# Patient Record
Sex: Female | Born: 1970 | Race: White | Hispanic: No | Marital: Married | Smoking: Never smoker
Health system: Southern US, Community
[De-identification: ages and names within clinical notes are randomized; demographics above are authoritative.]

## PROBLEM LIST (undated history)

## (undated) DIAGNOSIS — M1990 Unspecified osteoarthritis, unspecified site: Secondary | ICD-10-CM

## (undated) DIAGNOSIS — F32A Depression, unspecified: Secondary | ICD-10-CM

## (undated) DIAGNOSIS — K219 Gastro-esophageal reflux disease without esophagitis: Secondary | ICD-10-CM

## (undated) DIAGNOSIS — K5900 Constipation, unspecified: Secondary | ICD-10-CM

## (undated) DIAGNOSIS — F329 Major depressive disorder, single episode, unspecified: Secondary | ICD-10-CM

## (undated) DIAGNOSIS — F419 Anxiety disorder, unspecified: Secondary | ICD-10-CM

## (undated) HISTORY — DX: Unspecified osteoarthritis, unspecified site: M19.90

## (undated) HISTORY — PX: IUD REMOVAL: SHX5392

## (undated) HISTORY — DX: Major depressive disorder, single episode, unspecified: F32.9

## (undated) HISTORY — DX: Anxiety disorder, unspecified: F41.9

## (undated) HISTORY — PX: TOOTH EXTRACTION: SUR596

## (undated) HISTORY — DX: Gastro-esophageal reflux disease without esophagitis: K21.9

## (undated) HISTORY — DX: Depression, unspecified: F32.A

## (undated) HISTORY — DX: Constipation, unspecified: K59.00

---

## 1998-03-29 ENCOUNTER — Other Ambulatory Visit: Admission: RE | Admit: 1998-03-29 | Discharge: 1998-03-29 | Payer: Self-pay | Admitting: Obstetrics & Gynecology

## 1999-07-20 ENCOUNTER — Other Ambulatory Visit: Admission: RE | Admit: 1999-07-20 | Discharge: 1999-07-20 | Payer: Self-pay | Admitting: Obstetrics and Gynecology

## 1999-08-23 ENCOUNTER — Ambulatory Visit (HOSPITAL_COMMUNITY): Admission: RE | Admit: 1999-08-23 | Discharge: 1999-08-23 | Payer: Self-pay | Admitting: Internal Medicine

## 2002-11-22 ENCOUNTER — Other Ambulatory Visit: Admission: RE | Admit: 2002-11-22 | Discharge: 2002-11-22 | Payer: Self-pay | Admitting: *Deleted

## 2003-02-20 ENCOUNTER — Inpatient Hospital Stay (HOSPITAL_COMMUNITY): Admission: AD | Admit: 2003-02-20 | Discharge: 2003-02-20 | Payer: Self-pay | Admitting: Obstetrics and Gynecology

## 2003-05-12 ENCOUNTER — Inpatient Hospital Stay (HOSPITAL_COMMUNITY): Admission: AD | Admit: 2003-05-12 | Discharge: 2003-05-12 | Payer: Self-pay | Admitting: Obstetrics and Gynecology

## 2003-06-20 ENCOUNTER — Inpatient Hospital Stay (HOSPITAL_COMMUNITY): Admission: AD | Admit: 2003-06-20 | Discharge: 2003-06-25 | Payer: Self-pay | Admitting: Obstetrics and Gynecology

## 2003-06-26 ENCOUNTER — Encounter: Admission: RE | Admit: 2003-06-26 | Discharge: 2003-07-26 | Payer: Self-pay | Admitting: Obstetrics and Gynecology

## 2003-07-27 ENCOUNTER — Encounter: Admission: RE | Admit: 2003-07-27 | Discharge: 2003-08-26 | Payer: Self-pay | Admitting: Obstetrics and Gynecology

## 2003-08-05 ENCOUNTER — Other Ambulatory Visit: Admission: RE | Admit: 2003-08-05 | Discharge: 2003-08-05 | Payer: Self-pay | Admitting: Obstetrics and Gynecology

## 2003-08-11 ENCOUNTER — Encounter: Admission: RE | Admit: 2003-08-11 | Discharge: 2003-08-11 | Payer: Self-pay | Admitting: Obstetrics and Gynecology

## 2003-08-27 ENCOUNTER — Encounter: Admission: RE | Admit: 2003-08-27 | Discharge: 2003-09-26 | Payer: Self-pay | Admitting: Obstetrics and Gynecology

## 2004-05-21 ENCOUNTER — Encounter: Admission: RE | Admit: 2004-05-21 | Discharge: 2004-05-21 | Payer: Self-pay | Admitting: *Deleted

## 2004-08-15 ENCOUNTER — Other Ambulatory Visit: Admission: RE | Admit: 2004-08-15 | Discharge: 2004-08-15 | Payer: Self-pay | Admitting: Obstetrics and Gynecology

## 2005-06-11 ENCOUNTER — Encounter: Admission: RE | Admit: 2005-06-11 | Discharge: 2005-06-11 | Payer: Self-pay | Admitting: Family Medicine

## 2005-09-11 ENCOUNTER — Other Ambulatory Visit: Admission: RE | Admit: 2005-09-11 | Discharge: 2005-09-11 | Payer: Self-pay | Admitting: Obstetrics and Gynecology

## 2006-03-20 ENCOUNTER — Inpatient Hospital Stay (HOSPITAL_COMMUNITY): Admission: RE | Admit: 2006-03-20 | Discharge: 2006-03-23 | Payer: Self-pay | Admitting: Obstetrics and Gynecology

## 2006-03-24 ENCOUNTER — Encounter: Admission: RE | Admit: 2006-03-24 | Discharge: 2006-04-22 | Payer: Self-pay | Admitting: Obstetrics and Gynecology

## 2006-04-23 ENCOUNTER — Encounter: Admission: RE | Admit: 2006-04-23 | Discharge: 2006-05-23 | Payer: Self-pay | Admitting: Obstetrics and Gynecology

## 2006-05-24 ENCOUNTER — Encounter: Admission: RE | Admit: 2006-05-24 | Discharge: 2006-06-22 | Payer: Self-pay | Admitting: Obstetrics and Gynecology

## 2006-06-23 ENCOUNTER — Encounter: Admission: RE | Admit: 2006-06-23 | Discharge: 2006-07-23 | Payer: Self-pay | Admitting: Obstetrics and Gynecology

## 2006-07-24 ENCOUNTER — Encounter: Admission: RE | Admit: 2006-07-24 | Discharge: 2006-08-23 | Payer: Self-pay | Admitting: Obstetrics and Gynecology

## 2006-08-24 ENCOUNTER — Encounter: Admission: RE | Admit: 2006-08-24 | Discharge: 2006-09-21 | Payer: Self-pay | Admitting: Obstetrics and Gynecology

## 2006-09-22 ENCOUNTER — Encounter: Admission: RE | Admit: 2006-09-22 | Discharge: 2006-10-22 | Payer: Self-pay | Admitting: Obstetrics and Gynecology

## 2006-10-23 ENCOUNTER — Encounter: Admission: RE | Admit: 2006-10-23 | Discharge: 2006-10-28 | Payer: Self-pay | Admitting: Obstetrics and Gynecology

## 2007-10-09 ENCOUNTER — Ambulatory Visit (HOSPITAL_COMMUNITY): Admission: RE | Admit: 2007-10-09 | Discharge: 2007-10-09 | Payer: Self-pay | Admitting: Obstetrics and Gynecology

## 2007-10-09 ENCOUNTER — Encounter (INDEPENDENT_AMBULATORY_CARE_PROVIDER_SITE_OTHER): Payer: Self-pay | Admitting: Obstetrics and Gynecology

## 2010-03-23 ENCOUNTER — Encounter: Payer: Self-pay | Admitting: Cardiology

## 2010-05-16 DIAGNOSIS — R42 Dizziness and giddiness: Secondary | ICD-10-CM

## 2010-05-16 DIAGNOSIS — R0602 Shortness of breath: Secondary | ICD-10-CM

## 2010-05-16 DIAGNOSIS — F411 Generalized anxiety disorder: Secondary | ICD-10-CM | POA: Insufficient documentation

## 2010-05-16 DIAGNOSIS — K219 Gastro-esophageal reflux disease without esophagitis: Secondary | ICD-10-CM | POA: Insufficient documentation

## 2010-05-16 DIAGNOSIS — E559 Vitamin D deficiency, unspecified: Secondary | ICD-10-CM | POA: Insufficient documentation

## 2010-05-17 ENCOUNTER — Encounter: Payer: Self-pay | Admitting: Cardiology

## 2010-05-17 ENCOUNTER — Ambulatory Visit: Payer: Self-pay | Admitting: Cardiology

## 2010-05-17 DIAGNOSIS — I059 Rheumatic mitral valve disease, unspecified: Secondary | ICD-10-CM | POA: Insufficient documentation

## 2010-05-17 DIAGNOSIS — I959 Hypotension, unspecified: Secondary | ICD-10-CM

## 2010-05-29 ENCOUNTER — Ambulatory Visit: Payer: Self-pay

## 2010-05-29 ENCOUNTER — Ambulatory Visit: Payer: Self-pay | Admitting: Cardiovascular Disease

## 2010-05-29 ENCOUNTER — Encounter: Payer: Self-pay | Admitting: Cardiology

## 2010-05-29 ENCOUNTER — Telehealth: Payer: Self-pay | Admitting: Cardiology

## 2010-05-29 ENCOUNTER — Ambulatory Visit (HOSPITAL_COMMUNITY): Admission: RE | Admit: 2010-05-29 | Discharge: 2010-05-29 | Payer: Self-pay | Admitting: Cardiology

## 2010-08-07 ENCOUNTER — Encounter
Admission: RE | Admit: 2010-08-07 | Discharge: 2010-08-07 | Payer: Self-pay | Source: Home / Self Care | Attending: Obstetrics and Gynecology | Admitting: Obstetrics and Gynecology

## 2010-08-14 NOTE — Progress Notes (Signed)
Summary: [facility] Annual Physical Exam Note   [facility] Physical Exam Note   Imported By: Roderic Ovens 05/29/2010 13:14:59  _____________________________________________________________________  External Attachment:    Type:   Image     Comment:   External Document

## 2010-08-14 NOTE — Assessment & Plan Note (Signed)
Summary: NP6/ H/O DYSFEN , HEART MUMUR , PT HAS BCBS./ GD   CC:  pt states she has mitral valve prolapse....pt states she been feeling tired and states she feels like her BP is low...she states she has seen Dr. Tenny Craw in the past.  History of Present Illness: 40 year old female for evaluation of mitral valve prolapse. Patient seen in the past by Dr. Tenny Craw for syncope. She had a positive tilt table. Apparently a previous echo showed mitral valve prolapse but I do not have those records available. There was also a question of autonomic dysfunction. She has not been seen for approximately 10 years. She denies dyspnea on exertion, orthopnea, PND, pedal edema, chest pain. She does describe low blood pressure chronically. She has some lightheadedness with standing but has had no further syncopal episodes since she was seen previously. She occasionally feels a brief skipped. Because of her history of mitral valve prolapse she requested an evaluation.  Current Medications (verified): 1)  Zoloft 50 Mg Tabs (Sertraline Hcl) .Marland Kitchen.. 1 Tab By Mouth Once Daily 2)  Nexium 20 Mg Cpdr (Esomeprazole Magnesium) .Marland Kitchen.. 1  Tab By Mouth Two Times A Day 3)  Birth Control .... As Directed 4)  Multivitamins   Tabs (Multiple Vitamin) .Marland Kitchen.. 1  Tab By Mouth Once Daily 5)  Latisse 0.03 % Soln (Bimatoprost) .... As Directed 6)  Vit D .... 1 Tab By Mouth Once Daily  Allergies: 1)  ! 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  Repeat low-transverse cesarean section.  Family History: Reviewed history from 05/16/2010 and no changes required. Pancreatitis DM HTN Father with CAD at unknown age  Social History: Reviewed history from 05/16/2010 and no changes  required. Married  Tobacco Use - No.  Alcohol Use - yes Regular Exercise - yes 2 children  Review of Systems       no fevers or chills, productive cough, hemoptysis, dysphasia, odynophagia, melena, hematochezia, dysuria, hematuria, rash, seizure activity, orthopnea, PND, pedal edema, claudication. Remaining systems are negative.   Vital Signs:  Patient profile:   40 year old female Height:      65 inches Weight:      136 pounds BMI:     22.71 Pulse rate:   79 / minute Resp:     14 per minute BP sitting:   90 / 70  (left arm)  Vitals Entered By: Kem Parkinson (May 17, 2010 2:54 PM)  Physical Exam  General:  Well developed/well nourished in NAD Skin warm/dry Patient not depressed No peripheral clubbing Back-normal HEENT-normal/normal eyelids Neck supple/normal carotid upstroke bilaterally; no bruits; no JVD; no thyromegaly chest - CTA/ normal expansion CV - RRR/normal S1 and S2; no murmurs, rubs or gallops;  PMI nondisplaced Abdomen -NT/ND, no HSM, no mass, + bowel sounds, no bruit 2+ femoral pulses, no bruits Ext-no edema, chords, 2+ DP Neuro-grossly nonfocal     EKG  Procedure date:  05/17/2010  Findings:      Sinus rhythm with no ST changes.  Impression & Recommendations:  Problem # 1:  MITRAL VALVE PROLAPSE (ICD-424.0) Not evident on examination. Repeat echocardiogram. Orders: Echocardiogram (Echo)  Problem # 2:  HYPOTENSION (ICD-458.9) Patient has an some orthostatic symptoms and has chronically low blood pressure. It does not appear he be significantly limiting.  I've asked her to liberalize her fluid and sodium intake. Medications can be considered in the future if it becomes more problematic.  Problem # 3:  GERD (ICD-530.81)  Her updated medication list for this problem includes:    Nexium 20 Mg Cpdr (Esomeprazole magnesium) .Marland Kitchen... 1  tab by mouth two times a day  Other Orders: EKG w/ Interpretation (93000)  Patient Instructions: 1)   Your physician recommends that you schedule a follow-up appointment in: as needed 2)  Your physician recommends that you continue on your current medications as directed. Please refer to the Current Medication list given to you today. 3)  Your physician has requested that you have an echocardiogram.  Echocardiography is a painless test that uses sound waves to create images of your heart. It provides your doctor with information about the size and shape of your heart and how well your heart's chambers and valves are working.  This procedure takes approximately one hour. There are no restrictions for this procedure.

## 2010-08-14 NOTE — Progress Notes (Signed)
Summary: pt returning call  Phone Note Call from Patient   Caller: Patient [phone] Reason for Call: Talk to Nurse Summary of Call: pt returning call to christine Initial call taken by: Glynda Jaeger,  May 29, 2010 3:56 PM  Follow-up for Phone Call        Phone Call Completed PT AWARE OF ECHO RESULTS. Follow-up by: Scherrie Bateman, LPN,  May 29, 2010 4:31 PM

## 2010-11-27 NOTE — Op Note (Signed)
[name], [name]              ACCOUNT NO.:  [id number]   MEDICAL RECORD NO.:  [id number]          PATIENT TYPE:  AMB   LOCATION:  SDC                           FACILITY:  [facility]   PHYSICIAN:  Michelle L. Grewal, M.D.DATE OF BIRTH:  06-02-1971   DATE OF PROCEDURE:  10/09/2007  DATE OF DISCHARGE:                               OPERATIVE REPORT   PREOPERATIVE DIAGNOSIS:  Retained intrauterine device.   POSTOPERATIVE DIAGNOSIS:  Retained intrauterine device.   OPERATION/PROCEDURE:  Removal of intrauterine device.   SURGEON:  Michelle L. Vincente Poli, M.D.   ANESTHESIA:  MAC with local.   FINDINGS:  The IUD is in normal location.   SPECIMENS:  IUD.   PATHOLOGY:  None.   ESTIMATED BLOOD LOSS:  Minimal.   COMPLICATIONS:  None.   PROCEDURE:  The patient was taken to the operating room.  She was given  sedation, placed in the lithotomy position.  She is prepped and draped  in the usual sterile fashion.  In and out catheter was used to empty the  bladder.  The speculum was inserted.  The cervix was grasped with a  tenaculum and a paracervical block was performed at 5 and 7 o'clock in  standard fashion.  Of note, IUD strings were not visualized.  I then  inserted the IUD hook, was able to locate the IUD in the cavity, then  removed the hook and then inserted a small polyp forceps and removed the  IUD easily with one attempt.  The string was attached but was wrapped  around the IUD apparatus. There was minimal bleeding noted.  All  instruments were removed from the vagina.  All sponge, lap and  instrument counts were correct x2.      Michelle L. Vincente Poli, M.D.  Electronically Signed     MLG/MEDQ  D:  10/09/2007  T:  10/10/2007  Job:  [id number]

## 2010-11-30 NOTE — Discharge Summary (Signed)
[name], [name]              ACCOUNT NO.:  [id number]   MEDICAL RECORD NO.:  [id number]          PATIENT TYPE:  INP   LOCATION:  9145                          FACILITY:  [facility]   PHYSICIAN:  Juluis Mire, M.D.   DATE OF BIRTH:  [birth year]   DATE OF ADMISSION:  03/20/2006  DATE OF DISCHARGE:  03/23/2006                                 DISCHARGE SUMMARY   ADMISSION DIAGNOSES:  1. Intrauterine pregnancy at term.  2. Previous cesarean section, desires repeat.   DISCHARGE DIAGNOSES:  1. Status post low transverse cesarean section.  2. A viable female infant.   PROCEDURE:  Repeat low transverse cesarean section.   REASON FOR ADMISSION:  Please see written H&P.   HOSPITAL COURSE:  The patient is 40 year old gravida 3, para 1, that  presented to [facility] for a scheduled cesarean section.  On the morning of admission the patient was taken to the operating room,  where spinal anesthesia was administered without difficulty.  A low  transverse incision was made with delivery of a viable female infant weighing  7 pounds 15 ounces, Apgars of 8 at one and 9 at five minutes.  The patient  tolerated the procedure well and was taken to the recovery room in stable  condition.  On postoperative day #1 the patient was without complaint with  exception of some erythema that she had noticed on her abdomen secondary to  adhesed of her surgical drape.  Vital signs were stable.  She was afebrile.  Abdomen soft.  Fundus firm and nontender.  Abdominal dressing was noted to  have a small amount of drainage noted on the bandage.  Laboratory findings  revealed hemoglobin of 10.9, platelet count of 250,000, WBC count of 10.0.  The patient was ordered to have some hydrocortisone to apply to the abdomen.  On postoperative day #2 the patient was without complaint.  Vital signs were  stable.  Fundus firm and nontender.  Abdominal dressing had been removed  revealing an incision that was clean,  dry and intact.  Erythema from  surgical drape was noted to be significantly improved.  She is ambulating  well.  On postoperative day #3 the patient was without complaint.  Vital  signs were stable.  Fundus firm and nontender.  Incision was clean, dry and  intact.  Staples were removed and the patient was later discharged home.   CONDITION ON DISCHARGE:  Good.   DIET:  Regular as tolerated.   ACTIVITY:  No heavy lifting, no driving x2 weeks, no vaginal entry.   FOLLOW-UP:  Patient to follow up in the office in 1 week for an incision  check.  She is to call for temperature greater than 100 degrees, persistent  nausea or vomiting, heavy vaginal bleeding and/or redness or drainage from  the incisional site.   DISCHARGE MEDICATIONS:  1. Tylox, number 30, one p.o. every 4-6 hour p.r.n.  2. Motrin 600 mg every 6 hours.  3. Prenatal vitamins one p.o. daily.  4. Colace one p.o. daily p.r.n.      [name], N.P.  Juluis Mire, M.D.  Electronically Signed    CC/MEDQ  D:  04/22/2006  T:  04/23/2006  Job:  [id number]

## 2010-11-30 NOTE — Op Note (Signed)
[name], EBER                        ACCOUNT NO.:  [id number]   MEDICAL RECORD NO.:  [id number]                   PATIENT TYPE:  INP   LOCATION:  9124                                 [facility]ACILITY:  [facility]   PHYSICIAN:  Michelle L. Vincente Poli, M.D.            DATE OF BIRTH:  [birth year]   DATE OF PROCEDURE:  06/21/2003  DATE OF DISCHARGE:                                 OPERATIVE REPORT   PREOPERATIVE DIAGNOSES:  1. Intrauterine pregnancy at term.  2. Failure to progress.   POSTOPERATIVE DIAGNOSES:  1. Intrauterine pregnancy at term.  2. Failure to progress.   PROCEDURE:  Primary low transverse cesarean section.   SURGEON:  Michelle L. Vincente Poli, M.D.   ANESTHESIA:  Epidural.   ESTIMATED BLOOD LOSS:  500 mL.   FINDINGS:  A female infant in cephalic presentation, Apgars 9 at one minute  and 9 at five minutes, weighing 8 pounds 2 ounces.   DESCRIPTION OF PROCEDURE:  The patient was taken to the operating room.  Epidural was dosed and found to be adequate.  She was prepped and draped in  the usual sterile fashion.  A Foley catheter was already in the bladder.  A  low transverse incision was made and carried down to the fascia.  The fascia  was scored in the midline and then entered.  The Pfannenstiel incision was  developed.  The rectus muscles were separated in the midline and __________  peritoneum __________ Bladder blade was then inserted into the peritoneal  cavity and the bladder blade was created sharply and digitally.  The bladder  blade was readjusted.  A low transverse incision was made in the uterus and  the uterus was entered using a hemostat.  The amniotic fluid was noted to be  clear.  The head was noted to be hyperextended.  It delivered easily with  the assistance of a vacuum.  The baby was a female infant with Apgars of 9 at  one minute and 9 at five minutes, and he weighed 8 pounds 2 ounces.  The  baby was handed to the waiting pediatrician and subsequently taken to  the  newborn nursery.  The placenta was manually removed, this appeared to be  normal in appearance and was removed.  The uterus was exteriorized and  cleared of all clots and debris.  The uterus was normal and adnexa were  normal.  The uterine incision was closed using 0 chromic in continuous  running locked stitch.  It was hemostatic.  The uterus was returned to the  abdomen.  The peritoneum was closed using 0 Vicryl continuous running  stitch,and the rectus muscles were reapproximated using the same stitch.  The fascia was closed using 0 Vicryl in a continuous running stitch starting  at each corner and meeting in the  midline.  After irrigation of the subcutaneous layer, the skin was closed  with staples.  All sponge, lap, and instrument  counts were correct x2.  The  patient tolerated the procedure well and went to the recovery room in stable  condition.                                               Michelle L. Vincente Poli, M.D.    Florestine Avers  D:  06/21/2003  T:  06/22/2003  Job:  [id number]

## 2010-11-30 NOTE — Procedures (Signed)
[place] [address]  Patient:    [name], [name]                        MRN: [id number] Proc. Date: 08/23/99 Adm. Date:  [id number] Disc. Date: [id number] Attending:  Lewayne Bunting CC:         Dietrich Pates, M.D., [facility]             Kathrine Cords             Marcelle Overlie, M.D.                           Procedure Report  PROCEDURE:  Head-up tilt table testing.  INDICATION:  The patient is a very pleasant 40 year old young lady with a several month history of syncope.  She is subsequently referred for head-up tilt table testing.  DESCRIPTION OF PROCEDURE:  After informed consent was obtained, the patient was  taken to the diagnostic EP lab in the fasted state.  After the usual preparation, the patient was placed in the supine position and her blood pressure was 125/67 and a pulse of 79.  She was allowed to maintain this position for five minutes and hen tilted to the 70 degree head-up position.  Her heart rate initially went down to 115/71 with a pulse of 82.  At five minutes in the tilting, she became diaphoretic, her pupils became dilated, and she was essentially unresponsive.  She had a heart rate that dropped down to 45 beats per minute and her blood pressure was 60 over palpable.  She became somewhat unresponsive and then she was placed back immediately in the supine position where she gradually had improvement in her symptoms and returned to stable blood pressure and heart rate.  She was returned to her room in good condition.  COMPLICATIONS:  None.  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  RECOMMENDATIONS:  The patient will follow up with Dr. Dietrich Pates.  I initially recommended that she be maintained on a high salt, high fluid diet and given her instructions on recognizing the warning signs of recurrent syncopal episodes. he may require other medical  therapy, although at this time she prefers not to. DD:  08/23/99 TD:  08/25/99 Job: [id number]

## 2010-11-30 NOTE — Op Note (Signed)
[name], [name]              ACCOUNT NO.:  [id number]   MEDICAL RECORD NO.:  [id number]          PATIENT TYPE:  INP   LOCATION:  9145                          [facility]ILITY[facility]   PHYSICIAN:  Michelle L. Grewal, M.D.DATE OF BIRTH:  Aug 09, 1970   DATE OF PROCEDURE:  03/20/2006  DATE OF DISCHARGE:                                 OPERATIVE REPORT   PREOPERATIVE DIAGNOSIS:  Intrauterine pregnancy at term, previous cesarean  section.   POSTOPERATIVE DIAGNOSIS:  Intrauterine pregnancy at term, previous cesarean  section.   PROCEDURE:  Repeat low-transverse cesarean section.   SURGEON:  Dr. Vincente Poli.   ANESTHESIA:  Spinal by Dr. Harvest Forest.   SPECIMENS:  Female infant, Apgars 9 at one minute, 9 at five minutes.   ESTIMATED BLOOD LOSS:  500 mL.   COMPLICATIONS:  None.   PROCEDURE:  Patient taken to the operating room.  Spinal was placed without  incident.  She is prepped and draped in the usual sterile fashion.  A Foley  catheter is inserted.  A low transverse incision was made and carried down  to the fascia.  Fascia scored in the midline and extended laterally.  The  rectus muscles were separated in the midline.  The peritoneum was entered  bluntly.  The peritoneal incision was then stretched.  The lower uterine  segment was identified.  The bladder flap was created sharply and then  digitally.  The bladder blade was readjusted.  A low transverse incision was  made in the uterus.  Uterus was entered using hemostat.  The baby was in  cephalic presentation and was delivered easily with one gentle pull of the  vacuum.  The baby was a female infant with Apgar 9 at one minute and 9 at five  minutes.  The cord was clamped and cut.  The baby was handed to the waiting  pediatrician, and the placenta was manually removed.  The uterus was  exteriorized and cleared of all clots and debris.  The uterus was closed in  1 layer using 0 chromic continuous running locked stitch.  The uterus was  returned  to the abdomen.  Irrigation was performed.  The peritoneum was  closed using 0 Vicryl continuous running stitch.  The rectus muscles were  reapproximated as well.  The fascia was closed using 0 Vicryl continuous  running stitch starting at each corner and meeting at the midline.  After  irrigation of subcutaneous layer, the skin was closed with staples.  All  sponge, lap and instrument counts were correct x2.  The patient went to  recovery room in stable condition.      Michelle L. Vincente Poli, M.D.  Electronically Signed    MLG/MEDQ  D:  03/20/2006  T:  03/20/2006  Job:  [id number]

## 2010-11-30 NOTE — Discharge Summary (Signed)
[name], [name]                        ACCOUNT NO.:  [id number]   MEDICAL RECORD NO.:  [id number]                   PATIENT TYPE:  INP   LOCATION:  9124                                 [facility]   PHYSICIAN:  Duke Salvia. Marcelle Overlie, M.D.            DATE OF BIRTH:  [birth year]   DATE OF ADMISSION:  06/20/2003  DATE OF DISCHARGE:  06/25/2003                                 DISCHARGE SUMMARY   ADMITTING DIAGNOSES:  1. Intrauterine pregnancy at 39-5/7 weeks' estimated gestational age.  2. Induction of labor.   DISCHARGE DIAGNOSIS:  Status post low-transverse cesarean section secondary  to failure to progress to a viable female infant.   PROCEDURE:  Primary low-transverse cesarean section.   REASON FOR ADMISSION:  Please see written H&P.   HOSPITAL COURSE:  The patient was a 40 year old white, married female,  gravida 2 para 0 that was admitted to [facility] for a two-  stage induction due to prolonged prodromal labor.  Fetal heart tones were  reactive.  The cervix was closed at the internal os, 30% effaced with a  vertex presentation.  Cytotec was placed into the vaginal fornix.  On the  following morning, fetal heart tones were reactive.  The patient was feeling  more pressure with uterine contractions.  The cervix was now dilated to 1.5-  cm dilated, 50% effaced.  Artifical rupture of membranes was performed  revealing clear fluid.  The patient was known to be positive for group B  beta strep and IV antibiotics were administered.  Pitocin was started per  protocol and an epidural was given for the patient's comfort.  Despite  adequate labor, as determined by intrauterine pressure catheter, there was  no further change in cervical dilatation.  The decision was made then to  proceed with a low-transverse cesarean section.  The patient was then  transferred to the operating room where an epidural was dosed adequately to  a surgical level.  A low transverse  incision was made with the delivery of a  viable female infant weighing 8 pounds 2 ounces with Apgar's of 9 at one  minute and 9 at five minutes.  The patient tolerated the procedure well and  was taken to a recovery room in stable condition.   On postoperative day one:  Vital signs are stable.  She was afebrile.  Abdomen was soft with good return of bowel function.  Fundus was firm and  nontender.  Abdominal dressing was noted to be clean, dry and intact.  Labs  revealed a hemoglobin of 10.1, platelet count of 199,000, WBC count of 13.6.  On postoperative day two: The patient was without complaint.  Abdomen was  soft.  Some slight ecchymosis was noted above the incisional site.  Fundus  was firm and nontender.  Incision was clean, dry and intact.  The patient  was ambulating well, tolerating a regular diet without complaints of nausea  or vomiting.  On postoperative day three:  Vital signs are stable.  Abdomen  was soft.  Ecchymosis continued.  Fundus was firm and nontender.  Incision  was clean, dry and intact.  On postoperative day four:  The patient was  doing well.  She was ambulating without assistance.  The incision was clean,  dry and intact.  Staples removed.  The patient was discharged home.   CONDITION ON DISCHARGE:  Good.   DIAGNOSIS:  Regular as tolerated.   DISCHARGE INSTRUCTIONS:  No heavy lifting.  No driving x 2 weeks.  No  vaginal entry.   FOLLOWUP:  The patient is to followup in the office in one week for an  incision check.  She is to call for temperature greater than 100 degrees,  persistent nausea, vomiting, heavy vaginal bleeding, any redness or drainage  from the incisional site.   DISCHARGE MEDICATIONS:  1. Tylox, #30, one p.o. q.4-6h. p.r.n. pain.  2. Motrin 600 mg every six hours p.r.n.  3. Prenatal vitamins one p.o. daily.  4. Colace one p.o. daily p.r.n.     [name], N.P.                        [name], M.D.    CC/MEDQ  D:   08/02/2003  T:  08/03/2003  Job:  [id number]

## 2011-02-18 ENCOUNTER — Other Ambulatory Visit: Payer: Self-pay | Admitting: Sports Medicine

## 2011-02-18 DIAGNOSIS — M25551 Pain in right hip: Secondary | ICD-10-CM

## 2011-02-19 ENCOUNTER — Ambulatory Visit
Admission: RE | Admit: 2011-02-19 | Discharge: 2011-02-19 | Disposition: A | Discharge status: Home / Self Care | Payer: BC Managed Care – PPO | Source: Ambulatory Visit | Attending: Sports Medicine | Admitting: Sports Medicine

## 2011-02-19 DIAGNOSIS — M25551 Pain in right hip: Secondary | ICD-10-CM

## 2011-04-08 LAB — CBC
HCT: 39.4
Hemoglobin: 13.8
MCHC: 35
MCV: 90
Platelets: 292
RBC: 4.38
RDW: 12.3
WBC: 4.8

## 2011-04-08 LAB — PREGNANCY, URINE: Preg Test, Ur: NEGATIVE

## 2012-08-20 ENCOUNTER — Other Ambulatory Visit: Payer: Self-pay | Admitting: Obstetrics and Gynecology

## 2013-02-04 ENCOUNTER — Encounter: Payer: Self-pay | Admitting: Internal Medicine

## 2013-02-04 ENCOUNTER — Ambulatory Visit (INDEPENDENT_AMBULATORY_CARE_PROVIDER_SITE_OTHER): Payer: BC Managed Care – PPO | Admitting: Internal Medicine

## 2013-02-04 VITALS — BP 106/64 | HR 104 | Ht 65.0 in | Wt 139.1 lb

## 2013-02-04 DIAGNOSIS — K5904 Chronic idiopathic constipation: Secondary | ICD-10-CM | POA: Insufficient documentation

## 2013-02-04 DIAGNOSIS — F411 Generalized anxiety disorder: Secondary | ICD-10-CM

## 2013-02-04 DIAGNOSIS — K5900 Constipation, unspecified: Secondary | ICD-10-CM

## 2013-02-04 MED ORDER — LINACLOTIDE 145 MCG PO CAPS: 145.0000 ug | ORAL_CAPSULE | Freq: Every day | ORAL | Status: DC

## 2013-02-04 NOTE — Progress Notes (Signed)
Patient ID: [name], female   DOB: Sep 28, 1970, 42 y.o.   MRN: [id number] HPI: [name] is a 42 year old female past medical history of GERD and anxiety who is seen for evaluation of chronic constipation. She is alone today.  She reports chronic constipation dating back over 20 years. She reports she is quite anxious about it. She says that it is very normal for her to have a bowel movement once or twice per week, beginning to as long as 10 days without bowel movement. This has been her normal habit over the last 20-25 years. Over the last 6 months she's been having the same frequency of bowel movement but has noticed lower abdominal cramping pain in a few minutes before a bowel movement. This pain is sharp in nature and relieved with defecation. It can be so intense that she becomes diaphoretic.  She describes this pain as a "uncomfortable tightening" She reports her bowel habits now have been no bowel movement for 5-10 days and then a period of lower abdominal cramping followed by a hard stool. Her hard stools are often followed by more soft loose stools on the day of her bowel movement. After this she has no complaints including no abdominal pain. She does have some bloating and borborygmi a day or 2 before her bowel movement. She denies blood in her stool or melena. No change in her stool caliber. No rectal pain. No nausea or vomiting. No weight loss. Over the last 7 days she has become anxious about having a bowel movement more regularly. She's been using MiraLax 17 g every day with no bowel movement. She's also been using docusate 200 mg at bedtime. Her last day or 2 she had presence to try and induce bowel movement.  No family history of inflammatory bowel disease or GI tract malignancy. Her father has a history of alcohol abuse and pancreatitis  Past Medical History  Diagnosis Date  . Anxiety   . Arthritis   . Depression   . GERD (gastroesophageal reflux disease)     Past Surgical  History  Procedure Laterality Date  . Cesarean section  2004/2007    Current Outpatient Prescriptions  Medication Sig Dispense Refill  . ALPRAZolam (XANAX) 0.25 MG tablet Take 0.25 mg by mouth at bedtime as needed for sleep.      Marland Kitchen desogestrel-ethinyl estradiol (VIORELE) 0.15-0.02/0.01 MG (21/5) tablet Take 1 tablet by mouth daily.      Marland Kitchen esomeprazole (NEXIUM) 40 MG capsule Take 40 mg by mouth daily.      . Linaclotide (LINZESS) 145 MCG CAPS Take 1 capsule (145 mcg total) by mouth daily.  30 capsule  6   No current facility-administered medications for this visit.    Allergies  Allergen Reactions  . Tramadol Hcl     Family History  Problem Relation Age of Onset  . Diabetes Father   . Heart disease Father   . Alcohol abuse Father   . Pancreatitis Father   . Lung cancer Maternal Grandfather   . Lung cancer Paternal Grandfather   . Diverticulosis Maternal Grandmother   . Arthritis Mother     avascular necrosis  . Depression Sister   . Obesity Sister     History  Substance Use Topics  . Smoking status: Never Smoker   . Smokeless tobacco: Never Used  . Alcohol Use: Yes     Comment: 1-2 per dau    ROS: As per history of present illness, otherwise negative  BP 106/64  Pulse 104  Ht 5\' 5"  (1.651 m)  Wt 139 lb 2 oz (63.107 kg)  BMI 23.15 kg/m2  LMP 01/14/2013 Constitutional: Well-developed and well-nourished. No distress. HEENT: Normocephalic and atraumatic. Oropharynx is clear and moist. No oropharyngeal exudate. Conjunctivae are normal.  No scleral icterus. Neck: Neck supple. Trachea midline. Cardiovascular: Normal rate, regular rhythm and intact distal pulses. No M/R/G Pulmonary/chest: Effort normal and breath sounds normal. No wheezing, rales or rhonchi. Abdominal: Soft, nontender, nondistended. Bowel sounds active throughout. There are no masses palpable. No hepatosplenomegaly. Extremities: no clubbing, cyanosis, or edema Lymphadenopathy: No cervical adenopathy  noted. Neurological: Alert and oriented to person place and time. Skin: Skin is warm and dry. No rashes noted. Psychiatric: Normal mood and affect. Behavior is normal.  RELEVANT LABS AND IMAGING: She reports a normal thyroid test in the last 6 months and a previous negative celiac test  ASSESSMENT/PLAN: 42 year old female past medical history of GERD and anxiety who is seen for evaluation of chronic constipation.  1.  Chronic idiopathic constipation -- her symptoms are very long-standing in nature and her bowel habits are consistent with chronic idiopathic constipation. She is not currently having any alarm symptoms including no bleeding, nerve caliber stools, weight loss. Her overall bowel function and constipation has caused considerable anxiety for her because she is now wondering when her next bowel movement will be. I would like to try her on Linzess 145 mcg daily.  First though I would like her to do half of the MiraLax prep in an attempt to purge the bowel. She will take 8 capsules (17g x 8 doses) resolved in 32 ounces of Gatorade. She can drink this over one to 2 hours. Then she will begin Linzess in the next several days.  I would like to see her back in 6 weeks to gauge her response to this medication. I would expect her lower abdominal cramping pain preceding defecation to improve with more regular bowel habits and less constipation.  If not we will investigate this further  2.  Anxiety -- anxiety exacerbated by her chronic constipation. She is using as needed Xanax prescribed by her gynecologist. I discussed how this is unlikely contributing to her constipation. However, an SSRI may work better for her in the long-term if her anxiety becomes more of a daily issue.  Return in 6 weeks

## 2013-02-04 NOTE — Patient Instructions (Addendum)
Dr. Rhea Belton recommends taking 8 capfuls of miralax in a 32 oz. Bottle of Gatorade. Wait 3 days then start taking linzess daily.  Follow up with Dr. Rhea Belton in office in 6 weeks.                                               We are excited to introduce MyChart, a new best-in-class service that provides you online access to important information in your electronic medical record. We want to make it easier for you to view your health information - all in one secure location - when and where you need it. We expect MyChart will enhance the quality of care and service we provide.  When you register for MyChart, you can:    View your test results.    Request appointments and receive appointment reminders via email.    Request medication renewals.    View your medical history, allergies, medications and immunizations.    Communicate with your physician's office through a password-protected site.    Conveniently print information such as your medication lists.  To find out if MyChart is right for you, please talk to a member of our clinical staff today. We will gladly answer your questions about this free health and wellness tool.  If you are age 42 or older and want a member of your family to have access to your record, you must provide written consent by completing a proxy form available at our office. Please speak to our clinical staff about guidelines regarding accounts for patients younger than age 42.  As you activate your MyChart account and need any technical assistance, please call the MyChart technical support line at [phone]) or email your question to [email or web address]. If you email your question(s), please include your name, a return phone number and the best time to reach you.  If you have non-urgent health-related questions, you can send a message to our office through MyChart at [email or web address]. If you have a medical emergency, call 911.  Thank  you for using MyChart as your new health and wellness resource!   MyChart licensed from [employer],  [id number]. Patents Pending.

## 2013-05-20 ENCOUNTER — Other Ambulatory Visit: Payer: Self-pay

## 2013-06-25 ENCOUNTER — Other Ambulatory Visit: Payer: Self-pay | Admitting: Dermatology

## 2013-09-13 ENCOUNTER — Other Ambulatory Visit: Payer: Self-pay | Admitting: Obstetrics and Gynecology

## 2013-09-17 ENCOUNTER — Other Ambulatory Visit: Payer: Self-pay | Admitting: Obstetrics and Gynecology

## 2013-09-17 DIAGNOSIS — Z803 Family history of malignant neoplasm of breast: Secondary | ICD-10-CM

## 2013-09-27 ENCOUNTER — Ambulatory Visit
Admission: RE | Admit: 2013-09-27 | Discharge: 2013-09-27 | Disposition: A | Discharge status: Home / Self Care | Payer: BC Managed Care – PPO | Source: Ambulatory Visit | Attending: Obstetrics and Gynecology | Admitting: Obstetrics and Gynecology

## 2013-09-27 DIAGNOSIS — Z803 Family history of malignant neoplasm of breast: Secondary | ICD-10-CM

## 2013-09-27 MED ORDER — GADOBENATE DIMEGLUMINE 529 MG/ML IV SOLN
13.0000 mL | Freq: Once | INTRAVENOUS | Status: AC | PRN
  Administered 2013-09-27: 13 mL via INTRAVENOUS

## 2013-09-29 ENCOUNTER — Other Ambulatory Visit: Payer: Self-pay | Admitting: Obstetrics and Gynecology

## 2013-09-29 DIAGNOSIS — R928 Other abnormal and inconclusive findings on diagnostic imaging of breast: Secondary | ICD-10-CM

## 2013-10-13 ENCOUNTER — Ambulatory Visit
Admission: RE | Admit: 2013-10-13 | Discharge: 2013-10-13 | Disposition: A | Discharge status: Home / Self Care | Payer: BC Managed Care – PPO | Source: Ambulatory Visit | Attending: Obstetrics and Gynecology | Admitting: Obstetrics and Gynecology

## 2013-10-13 ENCOUNTER — Other Ambulatory Visit: Payer: Self-pay | Admitting: Obstetrics and Gynecology

## 2013-10-13 DIAGNOSIS — R928 Other abnormal and inconclusive findings on diagnostic imaging of breast: Secondary | ICD-10-CM

## 2013-10-13 MED ORDER — GADOBENATE DIMEGLUMINE 529 MG/ML IV SOLN
13.0000 mL | Freq: Once | INTRAVENOUS | Status: AC | PRN
  Administered 2013-10-13: 13 mL via INTRAVENOUS

## 2013-10-21 ENCOUNTER — Other Ambulatory Visit: Payer: BC Managed Care – PPO

## 2014-06-06 ENCOUNTER — Other Ambulatory Visit: Payer: Self-pay | Admitting: Obstetrics and Gynecology

## 2014-06-06 DIAGNOSIS — N63 Unspecified lump in unspecified breast: Secondary | ICD-10-CM

## 2014-06-22 ENCOUNTER — Ambulatory Visit
Admission: RE | Admit: 2014-06-22 | Discharge: 2014-06-22 | Disposition: A | Discharge status: Home / Self Care | Payer: BC Managed Care – PPO | Source: Ambulatory Visit | Attending: Obstetrics and Gynecology | Admitting: Obstetrics and Gynecology

## 2014-06-22 DIAGNOSIS — N63 Unspecified lump in unspecified breast: Secondary | ICD-10-CM

## 2014-06-22 MED ORDER — GADOBENATE DIMEGLUMINE 529 MG/ML IV SOLN
12.0000 mL | Freq: Once | INTRAVENOUS | Status: AC | PRN
  Administered 2014-06-22: 12 mL via INTRAVENOUS

## 2014-10-28 ENCOUNTER — Other Ambulatory Visit: Payer: Self-pay | Admitting: Dermatology

## 2014-11-01 ENCOUNTER — Other Ambulatory Visit: Payer: Self-pay | Admitting: Obstetrics and Gynecology

## 2014-11-02 LAB — CYTOLOGY - PAP

## 2014-11-04 ENCOUNTER — Other Ambulatory Visit: Payer: Self-pay | Admitting: Obstetrics and Gynecology

## 2014-11-04 DIAGNOSIS — R928 Other abnormal and inconclusive findings on diagnostic imaging of breast: Secondary | ICD-10-CM

## 2014-11-10 ENCOUNTER — Ambulatory Visit
Admission: RE | Admit: 2014-11-10 | Discharge: 2014-11-10 | Disposition: A | Discharge status: Home / Self Care | Payer: PRIVATE HEALTH INSURANCE | Source: Ambulatory Visit | Attending: Obstetrics and Gynecology | Admitting: Obstetrics and Gynecology

## 2014-11-10 DIAGNOSIS — R928 Other abnormal and inconclusive findings on diagnostic imaging of breast: Secondary | ICD-10-CM

## 2017-08-01 DIAGNOSIS — H04123 Dry eye syndrome of bilateral lacrimal glands: Secondary | ICD-10-CM | POA: Diagnosis not present

## 2017-08-27 DIAGNOSIS — L719 Rosacea, unspecified: Secondary | ICD-10-CM | POA: Diagnosis not present

## 2017-12-17 DIAGNOSIS — Z86018 Personal history of other benign neoplasm: Secondary | ICD-10-CM | POA: Diagnosis not present

## 2017-12-17 DIAGNOSIS — D2262 Melanocytic nevi of left upper limb, including shoulder: Secondary | ICD-10-CM | POA: Diagnosis not present

## 2017-12-17 DIAGNOSIS — D225 Melanocytic nevi of trunk: Secondary | ICD-10-CM | POA: Diagnosis not present

## 2018-01-05 ENCOUNTER — Other Ambulatory Visit (HOSPITAL_COMMUNITY): Payer: Self-pay | Admitting: Obstetrics and Gynecology

## 2018-01-05 DIAGNOSIS — Z01419 Encounter for gynecological examination (general) (routine) without abnormal findings: Secondary | ICD-10-CM | POA: Diagnosis not present

## 2018-01-05 DIAGNOSIS — Z6821 Body mass index (BMI) 21.0-21.9, adult: Secondary | ICD-10-CM | POA: Diagnosis not present

## 2018-01-05 DIAGNOSIS — R1011 Right upper quadrant pain: Secondary | ICD-10-CM | POA: Diagnosis not present

## 2018-01-08 ENCOUNTER — Ambulatory Visit (HOSPITAL_COMMUNITY): Payer: PRIVATE HEALTH INSURANCE

## 2018-01-09 ENCOUNTER — Ambulatory Visit (HOSPITAL_COMMUNITY)
Admission: RE | Admit: 2018-01-09 | Discharge: 2018-01-09 | Disposition: A | Discharge status: Home / Self Care | Payer: PRIVATE HEALTH INSURANCE | Source: Ambulatory Visit | Attending: Obstetrics and Gynecology | Admitting: Obstetrics and Gynecology

## 2018-01-09 DIAGNOSIS — R1011 Right upper quadrant pain: Secondary | ICD-10-CM | POA: Diagnosis not present

## 2018-03-19 DIAGNOSIS — M25512 Pain in left shoulder: Secondary | ICD-10-CM | POA: Diagnosis not present

## 2018-03-30 DIAGNOSIS — M25512 Pain in left shoulder: Secondary | ICD-10-CM | POA: Diagnosis not present

## 2018-04-08 DIAGNOSIS — M25512 Pain in left shoulder: Secondary | ICD-10-CM | POA: Diagnosis not present

## 2018-04-29 DIAGNOSIS — Z23 Encounter for immunization: Secondary | ICD-10-CM | POA: Diagnosis not present

## 2018-05-11 DIAGNOSIS — M25562 Pain in left knee: Secondary | ICD-10-CM | POA: Diagnosis not present

## 2018-06-24 DIAGNOSIS — Z22321 Carrier or suspected carrier of Methicillin susceptible Staphylococcus aureus: Secondary | ICD-10-CM | POA: Diagnosis not present

## 2018-06-24 DIAGNOSIS — R202 Paresthesia of skin: Secondary | ICD-10-CM | POA: Diagnosis not present

## 2018-06-24 DIAGNOSIS — L810 Postinflammatory hyperpigmentation: Secondary | ICD-10-CM | POA: Diagnosis not present

## 2018-10-09 DIAGNOSIS — R1011 Right upper quadrant pain: Secondary | ICD-10-CM | POA: Diagnosis not present

## 2018-10-09 DIAGNOSIS — G8929 Other chronic pain: Secondary | ICD-10-CM | POA: Diagnosis not present

## 2018-10-09 DIAGNOSIS — K2950 Unspecified chronic gastritis without bleeding: Secondary | ICD-10-CM | POA: Diagnosis not present

## 2018-10-14 DIAGNOSIS — K2950 Unspecified chronic gastritis without bleeding: Secondary | ICD-10-CM | POA: Diagnosis not present

## 2018-10-14 DIAGNOSIS — R1011 Right upper quadrant pain: Secondary | ICD-10-CM | POA: Diagnosis not present

## 2018-10-14 DIAGNOSIS — G8929 Other chronic pain: Secondary | ICD-10-CM | POA: Diagnosis not present

## 2018-10-20 ENCOUNTER — Telehealth: Payer: Self-pay | Admitting: Internal Medicine

## 2018-11-04 NOTE — Telephone Encounter (Signed)
Appointment w/Dr Hilarie Fredrickson  scheduled 11-09-2018

## 2018-11-05 ENCOUNTER — Encounter: Payer: Self-pay | Admitting: *Deleted

## 2018-11-09 ENCOUNTER — Other Ambulatory Visit: Payer: Self-pay

## 2018-11-09 ENCOUNTER — Encounter: Payer: Self-pay | Admitting: Internal Medicine

## 2018-11-09 ENCOUNTER — Ambulatory Visit (INDEPENDENT_AMBULATORY_CARE_PROVIDER_SITE_OTHER): Payer: 59 | Admitting: Internal Medicine

## 2018-11-09 VITALS — Ht 65.0 in | Wt 127.0 lb

## 2018-11-09 DIAGNOSIS — K5909 Other constipation: Secondary | ICD-10-CM | POA: Diagnosis not present

## 2018-11-09 DIAGNOSIS — R1011 Right upper quadrant pain: Secondary | ICD-10-CM

## 2018-11-09 NOTE — Progress Notes (Signed)
This service was provided via telemedicine.   Zoom platform with A/V communication The patient was located at home The provider was located in GI office The patient did consent to this telephone visit and is aware of possible charges through their insurance for this visit.   The patient was referred by Dr. Melford Aase The other persons participating in this telemedicine service were patient and I Time spent on call: 20 min  HPI: [name] is a 48 year old female with a past medical history of chronic constipation, GERD, anxiety and depression who is seen to discuss reflux, abdominal pain and constipation.  She is seen virtually in the setting of COVID-19 pandemic.  I did see her in 2014 on one occasion where we predominantly discussed chronic constipation.  At that time I recommended Linzess 145 mcg daily after a bowel purge.  She did not follow-up thereafter.  Today, she reports that for the last 4 to 6 weeks she has been having right upper quadrant abdominal pain radiating to her back.  This was a constant gnawing type pain.  Started sometime in February.  Worse when she is sitting and even worse sitting on a hard surface.  Does not seem to change with eating.  No associated nausea, vomiting.  No heartburn or dysphagia.  No diarrhea.  She saw primary care and they started famotidine 40 mg which she has been taking twice a day and additionally using Tums.  This has improved her symptoms significantly.  Her pain was initially 5 out of 10 and interfering with sleep and it is now 1 out of 10.  No unintentional weight loss.  She is reduced her wine intake to 1 or 2 measured 4 ounce drinks per day.  She is not having any alcohol within 3 hours of going to bed.  This is a reduction in her overall alcohol intake.  She was worried about her gallbladder because her mother had emergency gallbladder surgery however she had an ultrasound done which showed normal gallbladder.  The entire abdominal ultrasound was  unremarkable.  She is taking Linzess 72 mcg but more on an as-needed basis.  She rarely uses it but will do so if she has not had a bowel movement in almost a week.  No change in bowel habits, blood in stool or melena.  She had lab work done recently by primary care.  Her CBC was normal as was her CMP.  She had a negative celiac panel in 2012  Past Medical History:  Diagnosis Date  . Anxiety   . Arthritis   . Depression   . GERD (gastroesophageal reflux disease)     Past Surgical History:  Procedure Laterality Date  . CESAREAN SECTION  2004/2007    Outpatient Medications Prior to Visit  Medication Sig Dispense Refill  . ALPRAZolam (XANAX) 0.25 MG tablet Take 0.25 mg by mouth at bedtime as needed for sleep.    . calcium carbonate (TUMS - DOSED IN MG ELEMENTAL CALCIUM) 500 MG chewable tablet Chew 1 tablet by mouth as needed for indigestion or heartburn.    . famotidine (PEPCID) 40 MG tablet Take 40 mg by mouth 2 (two) times daily.    Marland Kitchen linaclotide (LINZESS) 72 MCG capsule Take 72 mcg by mouth daily as needed.     No facility-administered medications prior to visit.     Allergies  Allergen Reactions  . Tramadol Hcl     Family History  Problem Relation Age of Onset  . Diabetes Father   .  Heart disease Father   . Alcohol abuse Father   . Pancreatitis Father   . Cirrhosis Father        alcohol related  . Lung cancer Maternal Grandfather   . Lung cancer Paternal Grandfather   . Diverticulosis Maternal Grandmother   . Arthritis Mother        avascular necrosis  . Gallbladder disease Mother   . Colon polyps Mother   . Depression Sister   . Obesity Sister   . Colon cancer Neg Hx   . Inflammatory bowel disease Neg Hx   . Pancreatic cancer Neg Hx   . Esophageal cancer Neg Hx   . Stomach cancer Neg Hx     Social History   Tobacco Use  . Smoking status: Never Smoker  . Smokeless tobacco: Never Used  Substance Use Topics  . Alcohol use: Yes    Comment: 1-2 per day   . Drug use: No    ROS: As per history of present illness, otherwise negative  Ht 5\' 5"  (1.651 m)   Wt 127 lb (57.6 kg)   BMI 21.13 kg/m  No physical exam, virtual visit  RELEVANT LABS AND IMAGING: ABDOMEN ULTRASOUND COMPLETE   COMPARISON:  None.   FINDINGS: Gallbladder: No gallstones or wall thickening visualized. No sonographic Murphy sign noted by sonographer.   Common bile duct: Diameter: 2.3 mm which is within normal limits.   Liver: No focal lesion identified. Within normal limits in parenchymal echogenicity. Portal vein is patent on color Doppler imaging with normal direction of blood flow towards the liver.   IVC: No abnormality visualized.   Pancreas: Visualized portion unremarkable.   Spleen: Size and appearance within normal limits.   Right Kidney: Length: 10.7 cm. Echogenicity within normal limits. No mass or hydronephrosis visualized.   Left Kidney: Length: 10.4 cm. Echogenicity within normal limits. No mass or hydronephrosis visualized.   Abdominal aorta: No aneurysm visualized.   Other findings: None.   IMPRESSION: No abnormality seen in the abdomen.     Electronically Signed   By: Marijo Conception, M.D.   On: 01/09/2018 13:13      ASSESSMENT/PLAN: 48 year old female with a past medical history of chronic constipation, GERD, anxiety and depression who is seen to discuss reflux, abdominal pain and constipation.    1.  Right upper quadrant pain --normal abdominal ultrasound and gallbladder anatomy.  The constant nature of the pain is not consistent with biliary type pain.  More suspicious for gastritis or even peptic ulcer disease.  She has responded favorably though not completely to Pepcid 40 mg twice daily and Tums.  I recommended the following --Begin pantoprazole 40 mg daily, 30 minutes before breakfast --Pepcid 40 mg can be used in the evening if needed; she does not need a prescription for this --H. pylori stool antigen; patient will  come to our lab to pick up the container --Repeat celiac panel --Repeat virtual visit in 1 month, if not resolved consider upper endoscopy or HIDA scan  2.  Chronic constipation --constipation is still an issue but she is using Linzess more on an as needed and infrequent basis.  We discussed how Linzess is designed for daily use and this would improve efficacy and likely symptoms.  The 72 mcg dose works better for her as she experienced diarrhea with 145 mcg dose. --Prescription for Linzess 72 mcg daily; please mail her a commercial insurance co-pay card     Cc:Chesley Noon, [address]  [place], [place] [phone]

## 2018-11-10 ENCOUNTER — Other Ambulatory Visit (INDEPENDENT_AMBULATORY_CARE_PROVIDER_SITE_OTHER): Payer: 59

## 2018-11-10 DIAGNOSIS — K5909 Other constipation: Secondary | ICD-10-CM

## 2018-11-10 DIAGNOSIS — R1011 Right upper quadrant pain: Secondary | ICD-10-CM | POA: Diagnosis not present

## 2018-11-10 LAB — IGA: IgA: 115 mg/dL (ref 68–378)

## 2018-11-10 MED ORDER — LINACLOTIDE 72 MCG PO CAPS: 72.0000 ug | ORAL_CAPSULE | Freq: Every day | ORAL | 1 refills | Status: DC

## 2018-11-10 MED ORDER — PANTOPRAZOLE SODIUM 40 MG PO TBEC: 40.0000 mg | DELAYED_RELEASE_TABLET | Freq: Every day | ORAL | 1 refills | Status: DC

## 2018-11-10 NOTE — Patient Instructions (Addendum)
We have sent the following medications to your pharmacy for you to pick up at your convenience: Pantoprazole 40 mg daily, 30 minutes before breakfast Linzess 72 mcg daily (we have also mailed a commercial insurance copay card to be given to your pharmacy)  Please purchase the following medications over the counter and take as directed: Pepcid 40 mg every evening when needed  Your provider has requested that you go to the basement level for lab work. Press "B" on the elevator. The lab is located at the first door on the left as you exit the elevator. (H Pylori stool antigen, IgA, ttg)  You have been scheduled for a ZOOM follow up on 12/11/18 at 3:00 pm with Dr Hilarie Fredrickson.

## 2018-11-10 NOTE — Addendum Note (Signed)
Addended by: Larina Bras on: 11/10/2018 11:51 AM   Modules accepted: Orders

## 2018-11-12 LAB — TISSUE TRANSGLUTAMINASE, IGA: (tTG) Ab, IgA: 1 U/mL

## 2018-11-17 ENCOUNTER — Other Ambulatory Visit: Payer: 59

## 2018-11-17 DIAGNOSIS — R1011 Right upper quadrant pain: Secondary | ICD-10-CM

## 2018-11-17 DIAGNOSIS — K5909 Other constipation: Secondary | ICD-10-CM | POA: Diagnosis not present

## 2018-11-18 LAB — HELICOBACTER PYLORI  SPECIAL ANTIGEN
MICRO NUMBER:: 446457
SPECIMEN QUALITY: ADEQUATE

## 2018-11-19 ENCOUNTER — Telehealth: Payer: Self-pay | Admitting: Internal Medicine

## 2018-11-19 NOTE — Telephone Encounter (Signed)
See result note.  

## 2018-11-19 NOTE — Telephone Encounter (Signed)
Pt calling for lab result from 11/17/18.

## 2018-11-19 NOTE — Telephone Encounter (Signed)
Patient wanting to know if lab results from 5.5.20 are back.

## 2018-11-20 ENCOUNTER — Encounter: Payer: Self-pay | Admitting: *Deleted

## 2018-11-20 NOTE — Telephone Encounter (Signed)
Patient notified of results via [facility] on 11/20/18.

## 2018-12-09 ENCOUNTER — Other Ambulatory Visit: Payer: Self-pay

## 2018-12-09 ENCOUNTER — Ambulatory Visit (INDEPENDENT_AMBULATORY_CARE_PROVIDER_SITE_OTHER): Payer: 59 | Admitting: Internal Medicine

## 2018-12-09 ENCOUNTER — Encounter: Payer: Self-pay | Admitting: Internal Medicine

## 2018-12-09 VITALS — Ht 65.0 in | Wt 128.0 lb

## 2018-12-09 DIAGNOSIS — R1011 Right upper quadrant pain: Secondary | ICD-10-CM | POA: Diagnosis not present

## 2018-12-09 DIAGNOSIS — K5909 Other constipation: Secondary | ICD-10-CM

## 2018-12-09 NOTE — Patient Instructions (Addendum)
Continue pantoprazole 40 mg but lets move this to 30 minutes before breakfast daily  Complete alcohol avoidance x1 month to see if the pain again resolves.  If your pain is present after 1 month despite alcohol abstinence then we will consider cross-sectional imaging and endoscopy.  Let me know if pain recurs or worsens before follow-up.  You have been scheduled for a VIRTUAL office visit with Dr Hilarie Fredrickson on 01/04/19 at 230pm  Use periodic Linzess 72 mcg as needed for constipation.  You will be due for a recall colonoscopy in 2023. We will send you a reminder in the mail when it gets closer to that time.

## 2018-12-09 NOTE — Progress Notes (Signed)
Subjective:    Patient ID: [name], female    DOB: 07-02-1971, 48 y.o.   MRN: [id number]  This service was provided via telemedicine.  Doximity app with A/V communication The patient was located at home The provider was located in provider's GI office. The patient did consent to this telephone visit and is aware of possible charges through their insurance for this visit.   The persons participating in this telemedicine service were the patient and I. Time spent on call: 20 minutes   HPI [name] is a 48 year old female with a past medical history of chronic constipation, GERD, anxiety and depression who is seen for follow-up.  She was seen initially on 11/09/2018 to discuss right upper quadrant pain and chronic constipation.  After her visit we performed H. pylori stool antigen which was negative, celiac panel which was normal/negative.  We began a 30-day PPI trial.  She had previously had an ultrasound in summer 2019 which showed normal gallbladder at that time.  Today she reports that she had done really well with the pantoprazole.  She been using it at bedtime.  And after about 7 days of therapy her right upper quadrant pain slowly resolved entirely and was gone for 2 weeks.  She reports it was a great 2 weeks and she did not even think about her symptoms.  Then after memorial day and for the last 2 days she had symptoms return nearly as severe as before.  She did admit to drinking more alcohol over the Memorial Day weekend then normal for her.  She reports having several cocktails and going to bed shortly thereafter.  Today she is feeling better.  No vomiting.  She continues to use Linzess 72 mcg on an as-needed basis for constipation which when used works well.  No bleeding, blood in her stool or melena.  The pain is worse when she sits or wears tight clothes around her upper abdomen.   Review of Systems As per HPI, otherwise negative  Current Medications, Allergies, Past  Medical History, Past Surgical History, Family History and Social History were reviewed in [employer] record.     Objective:   Physical Exam No physical exam, virtual visit  TTG normal, IgA level normal H. pylori stool antigen negative  ABDOMEN ULTRASOUND COMPLETE   COMPARISON:  None.   FINDINGS: Gallbladder: No gallstones or wall thickening visualized. No sonographic Murphy sign noted by sonographer.   Common bile duct: Diameter: 2.3 mm which is within normal limits.   Liver: No focal lesion identified. Within normal limits in parenchymal echogenicity. Portal vein is patent on color Doppler imaging with normal direction of blood flow towards the liver.   IVC: No abnormality visualized.   Pancreas: Visualized portion unremarkable.   Spleen: Size and appearance within normal limits.   Right Kidney: Length: 10.7 cm. Echogenicity within normal limits. No mass or hydronephrosis visualized.   Left Kidney: Length: 10.4 cm. Echogenicity within normal limits. No mass or hydronephrosis visualized.   Abdominal aorta: No aneurysm visualized.   Other findings: None.   IMPRESSION: No abnormality seen in the abdomen.     Electronically Signed   By: Marijo Conception, M.D.   On: 01/09/2018 13:13        Assessment & Plan:  48 year old female with a past medical history of chronic constipation, GERD, anxiety and depression who is seen for follow-up.  1.  Right upper quadrant pain --pain radiates towards the back but does  not seem to fluctuate based on food intake.  I am suspicious for gastritis, particular exacerbated by alcohol.  The pantoprazole had helped but then after alcohol indiscretion above her usual the pain return.  Her H. pylori and celiac panels were negative.  We discussed further evaluation including cross-sectional imaging with CT scan, possible HIDA or possible endoscopy.  We decided on the following: --Continue pantoprazole 40 mg but moved  to 30 minutes before breakfast daily --Complete alcohol avoidance x1 month to see if the pain again resolves --If pain present after 1 month despite alcohol abstinence then we will consider cross-sectional imaging and endoscopy --She will let me know if pain recurs or worsens before follow-up --1 month office follow-up, okay for virtual visit  2.  Chronic constipation --using periodic Linzess 72 mcg with success.  Continue this plan  3.  CRC screening --colonoscopy recommended at age 17

## 2018-12-11 ENCOUNTER — Ambulatory Visit: Payer: 59 | Admitting: Internal Medicine

## 2019-01-04 ENCOUNTER — Other Ambulatory Visit: Payer: Self-pay

## 2019-01-04 ENCOUNTER — Ambulatory Visit (INDEPENDENT_AMBULATORY_CARE_PROVIDER_SITE_OTHER): Payer: Self-pay | Admitting: Internal Medicine

## 2019-01-04 DIAGNOSIS — Z5329 Procedure and treatment not carried out because of patient's decision for other reasons: Secondary | ICD-10-CM

## 2019-01-04 DIAGNOSIS — Z91199 Patient's noncompliance with other medical treatment and regimen due to unspecified reason: Secondary | ICD-10-CM

## 2019-01-04 NOTE — Progress Notes (Signed)
   Subjective:    Patient ID: [name], female    DOB: [birth year], 48 y.o.   MRN: [id number]      HPI [name] is a 48 year old female with a history of chronic constipation, GERD, anxiety and depression who was scheduled today for follow-up.  She was last seen virtually by me on 12/09/2018.  At her last visit we discussed her right upper quadrant pain and I was suspicious for gastritis.  Her H. pylori and celiac panels were negative.  We discussed further imaging but decided to continue PPI, have her avoid alcohol for 1 month and follow-up today.  I was unable to reach her by phone on either her cell number or her home number.  She can be rescheduled for follow-up at her convenience     Review of Systems      Objective:   Physical Exam        Assessment & Plan:

## 2019-02-02 ENCOUNTER — Telehealth: Payer: 59 | Admitting: Physician Assistant

## 2019-02-02 ENCOUNTER — Other Ambulatory Visit: Payer: Self-pay

## 2019-02-02 ENCOUNTER — Encounter: Payer: Self-pay | Admitting: Physician Assistant

## 2019-02-02 DIAGNOSIS — R6889 Other general symptoms and signs: Secondary | ICD-10-CM | POA: Diagnosis not present

## 2019-02-02 DIAGNOSIS — R05 Cough: Secondary | ICD-10-CM | POA: Diagnosis not present

## 2019-02-02 DIAGNOSIS — R634 Abnormal weight loss: Secondary | ICD-10-CM | POA: Diagnosis not present

## 2019-02-02 DIAGNOSIS — Z20822 Contact with and (suspected) exposure to covid-19: Secondary | ICD-10-CM

## 2019-02-02 DIAGNOSIS — R059 Cough, unspecified: Secondary | ICD-10-CM

## 2019-02-02 MED ORDER — BENZONATATE 100 MG PO CAPS: 100.0000 mg | ORAL_CAPSULE | Freq: Two times a day (BID) | ORAL | 0 refills | Status: DC | PRN

## 2019-02-02 NOTE — Progress Notes (Signed)
E-Visit for Corona Virus Screening   Your current symptoms could be consistent with the coronavirus.  Many health care providers can now test patients at their office but not all are.  [facility] has multiple testing sites. For information on our COVID testing locations and hours go to [email or web address]  Please quarantine yourself while awaiting your test results.  We are enrolling you in our [place] for [facility] . Daily you will receive a questionnaire within the [facility] website. Our COVID 19 response team willl be monitoriing your responses daily.    COVID-19 is a respiratory illness with symptoms that are similar to the flu. Symptoms are typically mild to moderate, but there have been cases of severe illness and death due to the virus. The following symptoms may appear 2-14 days after exposure: . Fever . Cough . Shortness of breath or difficulty breathing . Chills . Repeated shaking with chills . Muscle pain . Headache . Sore throat . New loss of taste or smell . Fatigue . Congestion or runny nose . Nausea or vomiting . Diarrhea  It is vitally important that if you feel that you have an infection such as this virus or any other virus that you stay home and away from places where you may spread it to others.  You should self-quarantine for 14 days if you have symptoms that could potentially be coronavirus or have been in close contact a with a person diagnosed with COVID-19 within the last 2 weeks. You should avoid contact with people age 38 and older.   You should wear a mask or cloth face covering over your nose and mouth if you must be around other people or animals, including pets (even at home). Try to stay at least 6 feet away from other people. This will protect the people around you.  You can use medication such as A prescription cough medication called Tessalon Perles 100 mg. You may take 1-2 capsules every 8 hours as needed for  cough  You may also take acetaminophen (Tylenol) as needed for fever.  I have provided a work note You have been enrolled in [facility], which will contact you regarding your symptoms.  You stated that you have unintended weight loss, please have a face to face evaluation with your doctor for further workup of your unintended weigh loss.   Reduce your risk of any infection by using the same precautions used for avoiding the common cold or flu:  Marland Kitchen Wash your hands often with soap and warm water for at least 20 seconds.  If soap and water are not readily available, use an alcohol-based hand sanitizer with at least 60% alcohol.  . If coughing or sneezing, cover your mouth and nose by coughing or sneezing into the elbow areas of your shirt or coat, into a tissue or into your sleeve (not your hands). . Avoid shaking hands with others and consider head nods or verbal greetings only. . Avoid touching your eyes, nose, or mouth with unwashed hands.  . Avoid close contact with people who are sick. . Avoid places or events with large numbers of people in one location, like concerts or sporting events. . Carefully consider travel plans you have or are making. . If you are planning any travel outside or inside the [place], visit the CDC's Travelers' Health webpage for the latest health notices. . If you have some symptoms but not all symptoms, continue to monitor at home and seek medical attention  if your symptoms worsen. . If you are having a medical emergency, call 911.  HOME CARE . Only take medications as instructed by your medical team. . Drink plenty of fluids and get plenty of rest. . A steam or ultrasonic humidifier can help if you have congestion.   GET HELP RIGHT AWAY IF YOU HAVE EMERGENCY WARNING SIGNS** FOR COVID-19. If you or someone is showing any of these signs seek emergency medical care immediately. Call 911 or proceed to your closest emergency facility  if: . You develop worsening high fever. . Trouble breathing . Bluish lips or face . Persistent pain or pressure in the chest . New confusion . Inability to wake or stay awake . You cough up blood. . Your symptoms become more severe  **This list is not all possible symptoms. Contact your medical provider for any symptoms that are sever or concerning to you.   MAKE SURE YOU   Understand these instructions.  Will watch your condition.  Will get help right away if you are not doing well or get worse.  Your e-visit answers were reviewed by a board certified advanced clinical practitioner to complete your personal care plan.  Depending on the condition, your plan could have included both over the counter or prescription medications.  If there is a problem please reply once you have received a response from your provider.  Your safety is important to [place].  If you have drug allergies check your prescription carefully.    You can use MyChart to ask questions about today's visit, request a non-urgent call back, or ask for a work or school excuse for 24 hours related to this e-Visit. If it has been greater than 24 hours you will need to follow up with your provider, or enter a new e-Visit to address those concerns. You will get an e-mail in the next two days asking about your experience.  I hope that your e-visit has been valuable and will speed your recovery. Thank you for using e-visits.   I spent 5-10 minutes on review and completion of this note- Lacy Duverney [facility]

## 2019-02-04 LAB — NOVEL CORONAVIRUS, NAA: SARS-CoV-2, NAA: NOT DETECTED

## 2019-02-05 ENCOUNTER — Encounter (INDEPENDENT_AMBULATORY_CARE_PROVIDER_SITE_OTHER): Payer: Self-pay

## 2019-02-07 ENCOUNTER — Encounter (INDEPENDENT_AMBULATORY_CARE_PROVIDER_SITE_OTHER): Payer: Self-pay

## 2019-02-09 ENCOUNTER — Encounter (INDEPENDENT_AMBULATORY_CARE_PROVIDER_SITE_OTHER): Payer: Self-pay

## 2019-02-10 ENCOUNTER — Encounter (INDEPENDENT_AMBULATORY_CARE_PROVIDER_SITE_OTHER): Payer: Self-pay

## 2019-04-13 ENCOUNTER — Other Ambulatory Visit: Payer: Self-pay | Admitting: Internal Medicine

## 2019-04-14 ENCOUNTER — Other Ambulatory Visit: Payer: Self-pay

## 2019-04-14 MED ORDER — PANTOPRAZOLE SODIUM 40 MG PO TBEC: 40.0000 mg | DELAYED_RELEASE_TABLET | Freq: Every day | ORAL | 1 refills | Status: DC

## 2019-06-29 ENCOUNTER — Encounter: Payer: Self-pay | Admitting: Plastic Surgery

## 2019-06-29 ENCOUNTER — Ambulatory Visit (INDEPENDENT_AMBULATORY_CARE_PROVIDER_SITE_OTHER): Payer: 59 | Admitting: Plastic Surgery

## 2019-06-29 ENCOUNTER — Other Ambulatory Visit: Payer: Self-pay

## 2019-06-29 DIAGNOSIS — L989 Disorder of the skin and subcutaneous tissue, unspecified: Secondary | ICD-10-CM | POA: Insufficient documentation

## 2019-06-29 NOTE — Progress Notes (Signed)
   Subjective:    Patient ID: [name], female    DOB: 12-Dec-1970, 48 y.o.   MRN: [id number]  The patient is a 48 here for evaluation of a changing skin lesion on her lower lip.  Patient states she has had this excised at least two if not three times in the past.  It has been evaluated by pathology and has been negative.  Her most recent excision was approximately six months ago. she says it came back very quickly of a dark spot.  She does not like the way it looks and would like to have it removed.  No other skin lesions noted of concern.  It is at the red roll of the bottom left lip.  It is 2 mm in size.     Review of Systems  Constitutional: Negative.   HENT: Negative.   Eyes: Negative.   Respiratory: Negative.   Cardiovascular: Negative.   Gastrointestinal: Negative.   Genitourinary: Negative.   Musculoskeletal: Negative.        Objective:   Physical Exam Vitals and nursing note reviewed.  Constitutional:      Appearance: Normal appearance.  HENT:     Mouth/Throat:   Cardiovascular:     Rate and Rhythm: Normal rate.     Pulses: Normal pulses.  Pulmonary:     Effort: Pulmonary effort is normal.  Neurological:     General: No focal deficit present.     Mental Status: She is alert and oriented to person, place, and time.  Psychiatric:        Mood and Affect: Mood normal.        Behavior: Behavior normal.        Assessment & Plan:     ICD-10-CM   1. Skin lesion  L98.9      The IPL laser was set at 7 J and one dose given to the lip.  The patient is to call next week and let me know what it looks like.  Excision is an option.  The [place] was signed into law in 2016 which includes the topic of electronic health records.  This provides immediate access to information in MyChart.  This includes consultation notes, operative notes, office notes, lab results and pathology reports.  If you have any questions about what you read please let us know at  your next visit or call us at the office.  We are right here with you.

## 2019-07-15 ENCOUNTER — Ambulatory Visit: Payer: 59 | Attending: Internal Medicine

## 2019-07-15 DIAGNOSIS — Z20822 Contact with and (suspected) exposure to covid-19: Secondary | ICD-10-CM

## 2019-07-17 LAB — NOVEL CORONAVIRUS, NAA: SARS-CoV-2, NAA: NOT DETECTED

## 2019-08-18 NOTE — Telephone Encounter (Signed)
Please let patient know I received her email  Would recommend a MiraLAX bowel purge; 15 doses of MiraLAX in 64 ounces of Gatorade; drink slowly over 3 to 4 hours as tolerated to help purge bowel  Would then plan to use Linzess 72 to 145 mcg daily thereafter until she can be seen in the office  Office follow-up needs to be scheduled to further discuss symptoms and formulate assessment and plan

## 2019-08-19 ENCOUNTER — Ambulatory Visit (INDEPENDENT_AMBULATORY_CARE_PROVIDER_SITE_OTHER): Payer: 59 | Admitting: Gastroenterology

## 2019-08-19 ENCOUNTER — Encounter: Payer: Self-pay | Admitting: Gastroenterology

## 2019-08-19 VITALS — BP 96/66 | HR 86 | Temp 97.6°F | Ht 65.0 in | Wt 123.0 lb

## 2019-08-19 DIAGNOSIS — K5909 Other constipation: Secondary | ICD-10-CM

## 2019-08-19 NOTE — Progress Notes (Signed)
08/19/2019 JACOB WOLAK [id number] Dec 22, 1970   HISTORY OF PRESENT ILLNESS: This is a pleasant 49 year old female who is a patient of Dr. Vena Rua.  She has history of chronic constipation, GERD, anxiety and depression.  She is here today to discuss her constipation.  She tells me that it has been worsening recently.  She called here yesterday and was advised to use a MiraLAX bowel purge.  She says that she did that and she did have several liquid stools.  She says that she used to just take 72 mcg of Linzess on an as-needed basis and that would work wonderfully for her, but that has not been the case recently.  She has not used it regularly because of the fact that it usually tends to cause her diarrhea.  She has been just very frustrated with her symptoms and being able to have regular bowel habits.  This has really been worse over the past month.  She also notes that around the same time she started having hot flashes.  She tells me that her thyroid has been checked.   Past Medical History:  Diagnosis Date  . Anxiety   . Arthritis   . Depression   . GERD (gastroesophageal reflux disease)    Past Surgical History:  Procedure Laterality Date  . CESAREAN SECTION  2004/2007    reports that she has never smoked. She has never used smokeless tobacco. She reports current alcohol use. She reports that she does not use drugs. family history includes Alcohol abuse in her father; Arthritis in her mother; Cirrhosis in her father; Colon polyps in her mother; Depression in her sister; Diabetes in her father; Diverticulosis in her maternal grandmother; Gallbladder disease in her mother; Heart disease in her father; Lung cancer in her maternal grandfather and paternal grandfather; Obesity in her sister; Pancreatitis in her father. Allergies  Allergen Reactions  . Tramadol Hcl       Outpatient Encounter Medications as of 08/19/2019  Medication Sig  . ALPRAZolam (XANAX) 0.25 MG tablet Take 0.25  mg by mouth at bedtime as needed for sleep.  . calcium carbonate (TUMS - DOSED IN MG ELEMENTAL CALCIUM) 500 MG chewable tablet Chew 1 tablet by mouth as needed for indigestion or heartburn.  . linaclotide (LINZESS) 72 MCG capsule Take 1 capsule (72 mcg total) by mouth daily before breakfast.  . pantoprazole (PROTONIX) 40 MG tablet Take 1 tablet (40 mg total) by mouth daily.  . [DISCONTINUED] benzonatate (TESSALON) 100 MG capsule Take 1 capsule (100 mg total) by mouth 2 (two) times daily as needed for cough.  . [DISCONTINUED] famotidine (PEPCID) 40 MG tablet Take 40 mg by mouth 2 (two) times daily.   No facility-administered encounter medications on file as of 08/19/2019.     REVIEW OF SYSTEMS  : All other systems reviewed and negative except where noted in the History of Present Illness.   PHYSICAL EXAM: BP 96/66   Pulse 86   Temp 97.6 F (36.4 C)   Ht 5\' 5"  (1.651 m)   Wt 123 lb (55.8 kg)   BMI 20.47 kg/m  General: Well developed white female in no acute distress Head: Normocephalic and atraumatic Eyes:  Sclerae anicteric, conjunctiva pink. Ears: Normal auditory acuity Lungs: Clear throughout to auscultation; no increased WOB. Heart: Regular rate and rhythm; no M/R/G. Abdomen: Soft, non-distended.  BS present.  Non-tender. Musculoskeletal: Symmetrical with no gross deformities  Skin: No lesions on visible extremities Extremities: No edema  Neurological: Alert  oriented x 4, grossly non-focal Psychological:  Alert and cooperative. Normal mood and affect  ASSESSMENT AND PLAN: *Chronic constipation: Longstanding issue, but seems to be worse recently.  She is very frustrated that this has affected her daily life.  She does admit that she has been having hot flashes recently as well, coinciding with about the same amount of time of the worsening constipation issues.  I wonder if her constipation has been affected by changes in hormones, being perimenopausal.  Also reduced activity could  affect this as well.  Nonetheless, after extensive conversation she did decide to try Linzess 72 mcg daily for about 10 days.  She needs to try it regularly for this period of time to let her body become regulated to the medication at which time hopefully then any side effect of diarrhea would then subside.  If after a few days 72 mcg does not seem to be helpful then she was advised to increase to 145 mcg daily.  She will call with an update on her symptoms in a few weeks.  Samples of Linzess given. *CRC screening: We did discuss this and she said that she will consider in the near future.  **35 mins spent with the patient of which at least 50% was spent in counseling on diagnosis, treatment options, etc.   CC:  Chesley Noon, MD

## 2019-08-19 NOTE — Patient Instructions (Addendum)
Continue Linzess 62mcg daily for 7-10 days, if no bowel movement after a few days then increase to 168mcg once daily.   If you are age 49 or younger, your body mass index should be between 19-25. Your Body mass index is 20.47 kg/m. If this is out of the aformentioned range listed, please consider follow up with your Primary Care Provider.   Please call office in a few weeks with updates of symptoms. Please ask for Dr.Pyrtle Nurse- Vaughan Basta   Thank you for choosing me and [facility].  Janett Billow Zehr-PA

## 2019-08-30 ENCOUNTER — Encounter: Payer: Self-pay | Admitting: Plastic Surgery

## 2019-09-01 NOTE — Progress Notes (Signed)
Addendum: Reviewed and agree with assessment and management plan. Cristina Mattern M, MD  

## 2019-12-30 ENCOUNTER — Other Ambulatory Visit: Payer: Self-pay | Admitting: Internal Medicine

## 2020-04-20 MED ORDER — PANTOPRAZOLE SODIUM 40 MG PO TBEC: 40.0000 mg | DELAYED_RELEASE_TABLET | Freq: Every day | ORAL | 1 refills | Status: DC

## 2020-04-20 MED ORDER — LINACLOTIDE 72 MCG PO CAPS: 72.0000 ug | ORAL_CAPSULE | Freq: Every day | ORAL | 1 refills | Status: DC

## 2020-06-12 ENCOUNTER — Other Ambulatory Visit: Payer: Self-pay | Admitting: Gastroenterology

## 2020-11-09 ENCOUNTER — Other Ambulatory Visit: Payer: Self-pay | Admitting: Physician Assistant

## 2020-11-09 DIAGNOSIS — M25562 Pain in left knee: Secondary | ICD-10-CM

## 2020-11-27 ENCOUNTER — Other Ambulatory Visit: Payer: Self-pay | Admitting: Physician Assistant

## 2020-11-27 DIAGNOSIS — M25562 Pain in left knee: Secondary | ICD-10-CM

## 2020-11-29 ENCOUNTER — Ambulatory Visit
Admission: RE | Admit: 2020-11-29 | Discharge: 2020-11-29 | Disposition: A | Discharge status: Home / Self Care | Payer: 59 | Source: Ambulatory Visit | Attending: Physician Assistant | Admitting: Physician Assistant

## 2020-11-29 ENCOUNTER — Other Ambulatory Visit: Payer: Self-pay

## 2020-11-29 DIAGNOSIS — M25562 Pain in left knee: Secondary | ICD-10-CM

## 2020-11-29 MED ORDER — GADOBENATE DIMEGLUMINE 529 MG/ML IV SOLN
10.0000 mL | Freq: Once | INTRAVENOUS | Status: AC | PRN
  Administered 2020-11-29: 10 mL via INTRAVENOUS

## 2021-01-04 ENCOUNTER — Other Ambulatory Visit: Payer: Self-pay | Admitting: Internal Medicine

## 2021-07-03 ENCOUNTER — Other Ambulatory Visit: Payer: Self-pay | Admitting: Internal Medicine

## 2021-07-12 ENCOUNTER — Other Ambulatory Visit: Payer: Self-pay | Admitting: Internal Medicine

## 2021-08-06 ENCOUNTER — Encounter: Payer: Self-pay | Admitting: Internal Medicine

## 2021-08-27 ENCOUNTER — Other Ambulatory Visit: Payer: Self-pay

## 2021-08-27 ENCOUNTER — Ambulatory Visit (AMBULATORY_SURGERY_CENTER): Payer: 59 | Admitting: *Deleted

## 2021-08-27 VITALS — Ht 65.0 in | Wt 131.0 lb

## 2021-08-27 DIAGNOSIS — Z1211 Encounter for screening for malignant neoplasm of colon: Secondary | ICD-10-CM

## 2021-08-27 MED ORDER — NA SULFATE-K SULFATE-MG SULF 17.5-3.13-1.6 GM/177ML PO SOLN: 1.0000 | Freq: Once | ORAL | 0 refills | Status: AC

## 2021-08-27 MED ORDER — ONDANSETRON HCL 4 MG PO TABS: 4.0000 mg | ORAL_TABLET | ORAL | 0 refills | Status: DC

## 2021-08-27 NOTE — Progress Notes (Signed)
No egg or soy allergy known to patient  No issues known to pt with past sedation with any surgeries or procedures Patient denies ever being told they had issues or difficulty with intubation  No FH of Malignant Hyperthermia Pt is not on diet pills Pt is not on  home 02  Pt is not on blood thinners  Pt states issues with constipation despite 144 mg Linzess- not taking it daily - having constipation- Using Miralax too PRN - not taking daily - pt will do Miralax and Linzess 5 days prior as well  No A fib or A flutter  Pt is fully vaccinated  for Covid    NO PA's for preps discussed with pt In PV today  Discussed with pt there will be an out-of-pocket cost for prep and that varies from $0 to 70 +  dollars - pt verbalized understanding   Due to the COVID-19 pandemic we are asking patients to follow certain guidelines in PV and the [facility]   Pt aware of COVID protocols and [facility] guidelines   PV completed over the phone. Pt verified name, DOB, address and insurance during PV today.  Pt mailed instruction packet with copy of consent form to read and not return, and instructions.  Pt encouraged to call with questions or issues.  If pt has My chart, procedure instructions sent via My Chart  Emailed instructions to [email or web address]  Pt asked for Zofran for nausea for preps - sent in per protocol

## 2021-08-29 ENCOUNTER — Other Ambulatory Visit: Payer: Self-pay | Admitting: Obstetrics and Gynecology

## 2021-08-29 DIAGNOSIS — R928 Other abnormal and inconclusive findings on diagnostic imaging of breast: Secondary | ICD-10-CM

## 2021-09-03 ENCOUNTER — Ambulatory Visit
Admission: RE | Admit: 2021-09-03 | Discharge: 2021-09-03 | Disposition: A | Discharge status: Home / Self Care | Payer: 59 | Source: Ambulatory Visit | Attending: Obstetrics and Gynecology | Admitting: Obstetrics and Gynecology

## 2021-09-03 ENCOUNTER — Ambulatory Visit: Payer: 59

## 2021-09-03 DIAGNOSIS — R928 Other abnormal and inconclusive findings on diagnostic imaging of breast: Secondary | ICD-10-CM

## 2021-09-10 ENCOUNTER — Encounter: Payer: 59 | Admitting: Internal Medicine

## 2021-09-12 ENCOUNTER — Other Ambulatory Visit: Payer: 59

## 2021-09-17 ENCOUNTER — Other Ambulatory Visit: Payer: 59

## 2022-07-29 ENCOUNTER — Encounter: Payer: Self-pay | Admitting: Internal Medicine

## 2022-08-08 ENCOUNTER — Encounter: Payer: Self-pay | Admitting: Internal Medicine

## 2022-09-02 ENCOUNTER — Other Ambulatory Visit: Payer: Self-pay | Admitting: Obstetrics and Gynecology

## 2022-09-02 DIAGNOSIS — R928 Other abnormal and inconclusive findings on diagnostic imaging of breast: Secondary | ICD-10-CM

## 2022-09-04 ENCOUNTER — Ambulatory Visit
Admission: RE | Admit: 2022-09-04 | Discharge: 2022-09-04 | Disposition: A | Discharge status: Home / Self Care | Payer: 59 | Source: Ambulatory Visit | Attending: Obstetrics and Gynecology | Admitting: Obstetrics and Gynecology

## 2022-09-04 DIAGNOSIS — R928 Other abnormal and inconclusive findings on diagnostic imaging of breast: Secondary | ICD-10-CM

## 2022-09-19 ENCOUNTER — Ambulatory Visit (AMBULATORY_SURGERY_CENTER): Payer: 59 | Admitting: *Deleted

## 2022-09-19 VITALS — Ht 65.0 in | Wt 133.0 lb

## 2022-09-19 DIAGNOSIS — Z1211 Encounter for screening for malignant neoplasm of colon: Secondary | ICD-10-CM

## 2022-09-19 NOTE — Progress Notes (Signed)
No egg or soy allergy known to patient  No issues known to pt with past sedation with any surgeries or procedures Patient denies ever being intubated No issues with moving head or neck No issues with swallowing  No FH of Malignant Hyperthermia Pt is not on diet pills Pt is not on  home 02  Pt is not on blood thinners  Pt denies issues with constipation  Pt is not on dialysis Pt denies any upcoming cardiac testing Pt states weight is  Pt encouraged to use to use Singlecare or Goodrx to reduce cost  Patient's chart reviewed by Osvaldo Angst CNRA prior to previsit and patient appropriate for the [facility].  Previsit completed and red dot placed by patient's name on their procedure day (on provider's schedule).  . Visit by phone Instructions reviewed with pt and pt states understanding. Instructed to review again prior to procedure. Pt states they will. Instructions sent by mail with coupon if applicable and by my chart Pt has prep at home in date pre pt 10//24

## 2022-09-20 ENCOUNTER — Encounter: Payer: Self-pay | Admitting: Internal Medicine

## 2022-10-21 ENCOUNTER — Encounter: Payer: Self-pay | Admitting: Internal Medicine

## 2022-10-21 ENCOUNTER — Ambulatory Visit (AMBULATORY_SURGERY_CENTER): Payer: 59 | Admitting: Internal Medicine

## 2022-10-21 VITALS — BP 115/77 | HR 82 | Temp 97.7°F | Resp 12 | Ht 65.0 in | Wt 133.0 lb

## 2022-10-21 DIAGNOSIS — Z1211 Encounter for screening for malignant neoplasm of colon: Secondary | ICD-10-CM

## 2022-10-21 MED ORDER — SODIUM CHLORIDE 0.9 % IV SOLN: 500.0000 mL | Freq: Once | INTRAVENOUS | Status: DC

## 2022-10-21 NOTE — Progress Notes (Signed)
GASTROENTEROLOGY PROCEDURE H&P NOTE   Primary Care Physician: Eartha Inch, MD    Reason for Procedure:  Colon cancer screening  Plan:    Colonoscopy  Patient is appropriate for endoscopic procedure(s) in the ambulatory ([facility]) setting.  The nature of the procedure, as well as the risks, benefits, and alternatives were carefully and thoroughly reviewed with the patient. Ample time for discussion and questions allowed. The patient understood, was satisfied, and agreed to proceed.     HPI: [name] is a 52 y.o. female who presents for colonoscopy.  Medical history as below.  Tolerated the prep.  No recent chest pain or shortness of breath.  No abdominal pain today.  Past Medical History:  Diagnosis Date   Anxiety    Arthritis    mild knees   Constipation    on Linzess and Miralax   Depression    GERD (gastroesophageal reflux disease)     Past Surgical History:  Procedure Laterality Date   CESAREAN SECTION  2004/2007   IUD REMOVAL     TOOTH EXTRACTION      Prior to Admission medications   Medication Sig Start Date End Date Taking? Authorizing Provider  calcium carbonate (TUMS - DOSED IN MG ELEMENTAL CALCIUM) 500 MG chewable tablet Chew 1 tablet by mouth as needed for indigestion or heartburn.   Yes [provider]  DULoxetine (CYMBALTA) 30 MG capsule Take 30 mg by mouth daily. 07/30/21  Yes [provider]  gabapentin (NEURONTIN) 100 MG capsule Take 100 mg by mouth 3 (three) times daily. 200 at night 07/30/21  Yes [provider]  linaclotide (LINZESS) 145 MCG CAPS capsule Take 145 mcg by mouth daily before breakfast. 08/12/22  Yes [provider]  pantoprazole (PROTONIX) 40 MG tablet Take 40 mg by mouth daily.   Yes [provider]  zolpidem (AMBIEN) 10 MG tablet Take 10 mg by mouth at bedtime. 07/31/21  Yes [provider]  acetaminophen (TYLENOL) 325 MG tablet 1 tablet as needed Patient not taking:  Reported on 09/19/2022    [provider]  ALPRAZolam Prudy Feeler) 0.5 MG tablet Take 0.5 mg by mouth every 8 (eight) hours as needed. Patient not taking: Reported on 09/19/2022 05/31/21   [provider]  bimatoprost (LATISSE) 0.03 % ophthalmic solution Apply to eye at bedtime. Patient not taking: Reported on 09/19/2022 07/26/21   [provider]  methylphenidate (METADATE CD) 50 MG CR capsule Methylphenidate HCl Patient not taking: Reported on 10/21/2022    [provider]  valACYclovir (VALTREX) 500 MG tablet as needed. Patient not taking: Reported on 09/19/2022    [provider]    Current Outpatient Medications  Medication Sig Dispense Refill   calcium carbonate (TUMS - DOSED IN MG ELEMENTAL CALCIUM) 500 MG chewable tablet Chew 1 tablet by mouth as needed for indigestion or heartburn.     DULoxetine (CYMBALTA) 30 MG capsule Take 30 mg by mouth daily.     gabapentin (NEURONTIN) 100 MG capsule Take 100 mg by mouth 3 (three) times daily. 200 at night     linaclotide (LINZESS) 145 MCG CAPS capsule Take 145 mcg by mouth daily before breakfast.     pantoprazole (PROTONIX) 40 MG tablet Take 40 mg by mouth daily.     zolpidem (AMBIEN) 10 MG tablet Take 10 mg by mouth at bedtime.     acetaminophen (TYLENOL) 325 MG tablet 1 tablet as needed (Patient not taking: Reported on 09/19/2022)  ALPRAZolam (XANAX) 0.5 MG tablet Take 0.5 mg by mouth every 8 (eight) hours as needed. (Patient not taking: Reported on 09/19/2022)     bimatoprost (LATISSE) 0.03 % ophthalmic solution Apply to eye at bedtime. (Patient not taking: Reported on 09/19/2022)     methylphenidate (METADATE CD) 50 MG CR capsule Methylphenidate HCl (Patient not taking: Reported on 10/21/2022)     valACYclovir (VALTREX) 500 MG tablet as needed. (Patient not taking: Reported on 09/19/2022)     Current Facility-Administered Medications  Medication Dose Route Frequency Provider Last Rate Last Admin   0.9 %  sodium  chloride infusion  500 mL Intravenous Once Adaia Matthies, Carie Caddy, MD        Allergies as of 10/21/2022 - Review Complete 10/21/2022  Allergen Reaction Noted   Adacel [diphth-acell pertussis-tetanus] Other (See Comments) 02/01/2016   Tramadol hcl  08/23/2015   Tape  08/27/2021    Family History  Problem Relation Age of Onset   Arthritis Mother        avascular necrosis   Gallbladder disease Mother    Colon polyps Mother    Diabetes Father    Heart disease Father    Alcohol abuse Father    Pancreatitis Father    Cirrhosis Father        alcohol related   Depression Sister    Obesity Sister    Diverticulosis Maternal Grandmother    Lung cancer Maternal Grandfather    Lung cancer Paternal Grandfather    Colon cancer Neg Hx    Inflammatory bowel disease Neg Hx    Pancreatic cancer Neg Hx    Esophageal cancer Neg Hx    Stomach cancer Neg Hx    Rectal cancer Neg Hx     Social History   Socioeconomic History   Marital status: Married    Spouse name: Not on file   Number of children: 2   Years of education: Not on file   Highest education level: Not on file  Occupational History   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  Tobacco Use   Smoking status: Never   Smokeless tobacco: Never  Vaping Use   Vaping Use: Never used  Substance and Sexual Activity   Alcohol use: Yes    Comment: 1-2 per day   Drug use: No   Sexual activity: Not on file  Other Topics Concern   Not on file  Social History Narrative   Not on file   Social Determinants of Health   Financial Resource Strain: Not on file  Food Insecurity: Not on file  Transportation Needs: Not on file  Physical Activity: Not on file  Stress: Not on file  Social Connections: Not on file  Intimate Partner Violence: Not on file    Physical Exam: Vital signs in last 24 hours: @BP  128/76   Pulse 88   Temp 97.7 F (36.5 C) (Temporal)   Ht 5\' 5"  (1.651 m)   Wt 133 lb (60.3 kg)   SpO2 97%   BMI 22.13 kg/m  GEN: NAD EYE:  Sclerae anicteric ENT: MMM CV: Non-tachycardic Pulm: CTA b/l GI: Soft, NT/ND NEURO:  Alert & Oriented x 3   Erick Blinks, MD  Gastroenterology  10/21/2022 9:09 AM

## 2022-10-21 NOTE — Progress Notes (Signed)
Vss nad trans to pacu 

## 2022-10-21 NOTE — Patient Instructions (Signed)
YOU HAD AN ENDOSCOPIC PROCEDURE TODAY AT THE [facility]:   Refer to the procedure report that was given to you for any specific questions about what was found during the examination.  If the procedure report does not answer your questions, please call your gastroenterologist to clarify.  If you requested that your care partner not be given the details of your procedure findings, then the procedure report has been included in a sealed envelope for you to review at your convenience later.  YOU SHOULD EXPECT: Some feelings of bloating in the abdomen. Passage of more gas than usual.  Walking can help get rid of the air that was put into your GI tract during the procedure and reduce the bloating. If you had a lower endoscopy (such as a colonoscopy or flexible sigmoidoscopy) you may notice spotting of blood in your stool or on the toilet paper. If you underwent a bowel prep for your procedure, you may not have a normal bowel movement for a few days.  Please Note:  You might notice some irritation and congestion in your nose or some drainage.  This is from the oxygen used during your procedure.  There is no need for concern and it should clear up in a day or so.  SYMPTOMS TO REPORT IMMEDIATELY:  Following lower endoscopy (colonoscopy or flexible sigmoidoscopy):  Excessive amounts of blood in the stool  Significant tenderness or worsening of abdominal pains  Swelling of the abdomen that is new, acute  Fever of 100F or higher  For urgent or emergent issues, a gastroenterologist can be reached at any hour by calling [phone]. Do not use MyChart messaging for urgent concerns.    DIET:  We do recommend a small meal at first, but then you may proceed to your regular diet.  Drink plenty of fluids but you should avoid alcoholic beverages for 24 hours.  ACTIVITY:  You should plan to take it easy for the rest of today and you should NOT DRIVE or use heavy machinery until tomorrow (because of  the sedation medicines used during the test).    FOLLOW UP: Our staff will call the number listed on your records the next business day following your procedure.  We will call around 7:15- 8:00 am to check on you and address any questions or concerns that you may have regarding the information given to you following your procedure. If we do not reach you, we will leave a message.     If any biopsies were taken you will be contacted by phone or by letter within the next 1-3 weeks.  Please call us at [phone] if you have not heard about the biopsies in 3 weeks.    SIGNATURES/CONFIDENTIALITY: You and/or your care partner have signed paperwork which will be entered into your electronic medical record.  These signatures attest to the fact that that the information above on your After Visit Summary has been reviewed and is understood.  Full responsibility of the confidentiality of this discharge information lies with you and/or your care-partner.  

## 2022-10-21 NOTE — Op Note (Signed)
[facility] Patient Name: [name] Procedure Date: 10/21/2022 9:08 AM MRN: [id number] Endoscopist: Beverley Fiedler , MD, [id number] Age: 52 Referring MD:  Date of Birth: Jun 15, 1971 Gender: Female Account #: [id number] Procedure:                Colonoscopy Indications:              Screening for colorectal malignant neoplasm, This                            is the patient's first colonoscopy Medicines:                Monitored Anesthesia Care Procedure:                Pre-Anesthesia Assessment:                           - Prior to the procedure, a History and Physical                            was performed, and patient medications and                            allergies were reviewed. The patient's tolerance of                            previous anesthesia was also reviewed. The risks                            and benefits of the procedure and the sedation                            options and risks were discussed with the patient.                            All questions were answered, and informed consent                            was obtained. Prior Anticoagulants: The patient has                            taken no anticoagulant or antiplatelet agents. ASA                            Grade Assessment: II - A patient with mild systemic                            disease. After reviewing the risks and benefits,                            the patient was deemed in satisfactory condition to                            undergo the procedure.  After obtaining informed consent, the colonoscope                            was passed under direct vision. Throughout the                            procedure, the patient's blood pressure, pulse, and                            oxygen saturations were monitored continuously. The                            Olympus PCF-[id number]) Colonoscope was                            introduced through the anus  and advanced to the                            cecum, identified by appendiceal orifice and                            ileocecal valve. The colonoscopy was performed                            without difficulty. The patient tolerated the                            procedure well. The quality of the bowel                            preparation was good. The ileocecal valve,                            appendiceal orifice, and rectum were photographed. Scope In: 9:21:31 AM Scope Out: 9:37:23 AM Scope Withdrawal Time: 0 hours 12 minutes 12 seconds  Total Procedure Duration: 0 hours 15 minutes 52 seconds  Findings:                 The digital rectal exam was normal.                           The colon (entire examined portion) appeared normal.                           Internal hemorrhoids were found during                            retroflexion. The hemorrhoids were small. Complications:            No immediate complications. Estimated Blood Loss:     Estimated blood loss: none. Impression:               - The entire examined colon is normal.                           - Small internal hemorrhoids.                           -  No specimens collected. Recommendation:           - Patient has a contact number available for                            emergencies. The signs and symptoms of potential                            delayed complications were discussed with the                            patient. Return to normal activities tomorrow.                            Written discharge instructions were provided to the                            patient.                           - Resume previous diet.                           - Continue present medications.                           - Repeat colonoscopy in 10 years for screening                            purposes. Beverley Fiedler, MD 10/21/2022 9:40:23 AM This report has been signed electronically.

## 2022-10-21 NOTE — Progress Notes (Signed)
Pt's states no medical or surgical changes since previsit or office visit. 

## 2022-10-22 ENCOUNTER — Telehealth: Payer: Self-pay

## 2022-10-22 NOTE — Telephone Encounter (Signed)
  Follow up Call-     10/21/2022    8:19 AM  Call back number  Post procedure Call Back phone  # [phone]  Permission to leave phone message Yes     Patient questions:  Do you have a fever, pain , or abdominal swelling? No. Pain Score  0 *  Have you tolerated food without any problems? Yes.    Have you been able to return to your normal activities? Yes.    Do you have any questions about your discharge instructions: Diet   No. Medications  No. Follow up visit  No.  Do you have questions or concerns about your Care? No.  Actions: * If pain score is 4 or above: No action needed, pain <4.

## 2023-04-11 IMAGING — MG MM DIGITAL DIAGNOSTIC UNILAT*L* W/ TOMO W/ CAD
4 series · 4 of 12 positions shown · non-contrast
Comparison: Previous exam(s).

CLINICAL DATA: 50-year-old female recalled from screening mammogram
dated 08/28/2021 for a possible left breast asymmetry.

EXAM:
DIGITAL DIAGNOSTIC UNILATERAL LEFT MAMMOGRAM WITH TOMOSYNTHESIS AND
CAD
TECHNIQUE: Left digital diagnostic mammography and breast tomosynthesis was
performed. The images were evaluated with computer-aided detection.

[L MLO synth-2D]
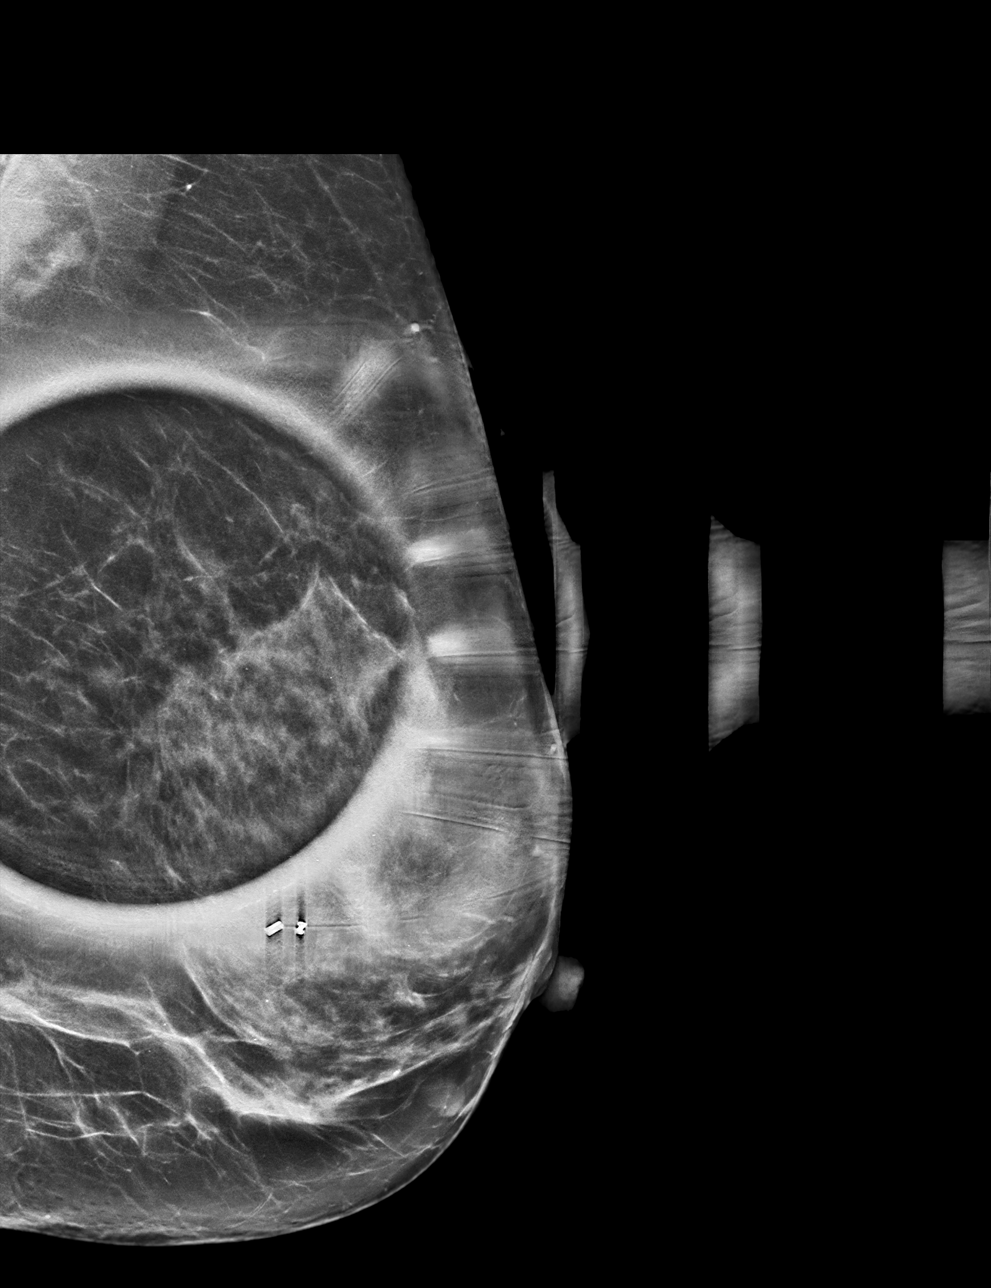

[L ML synth-2D]
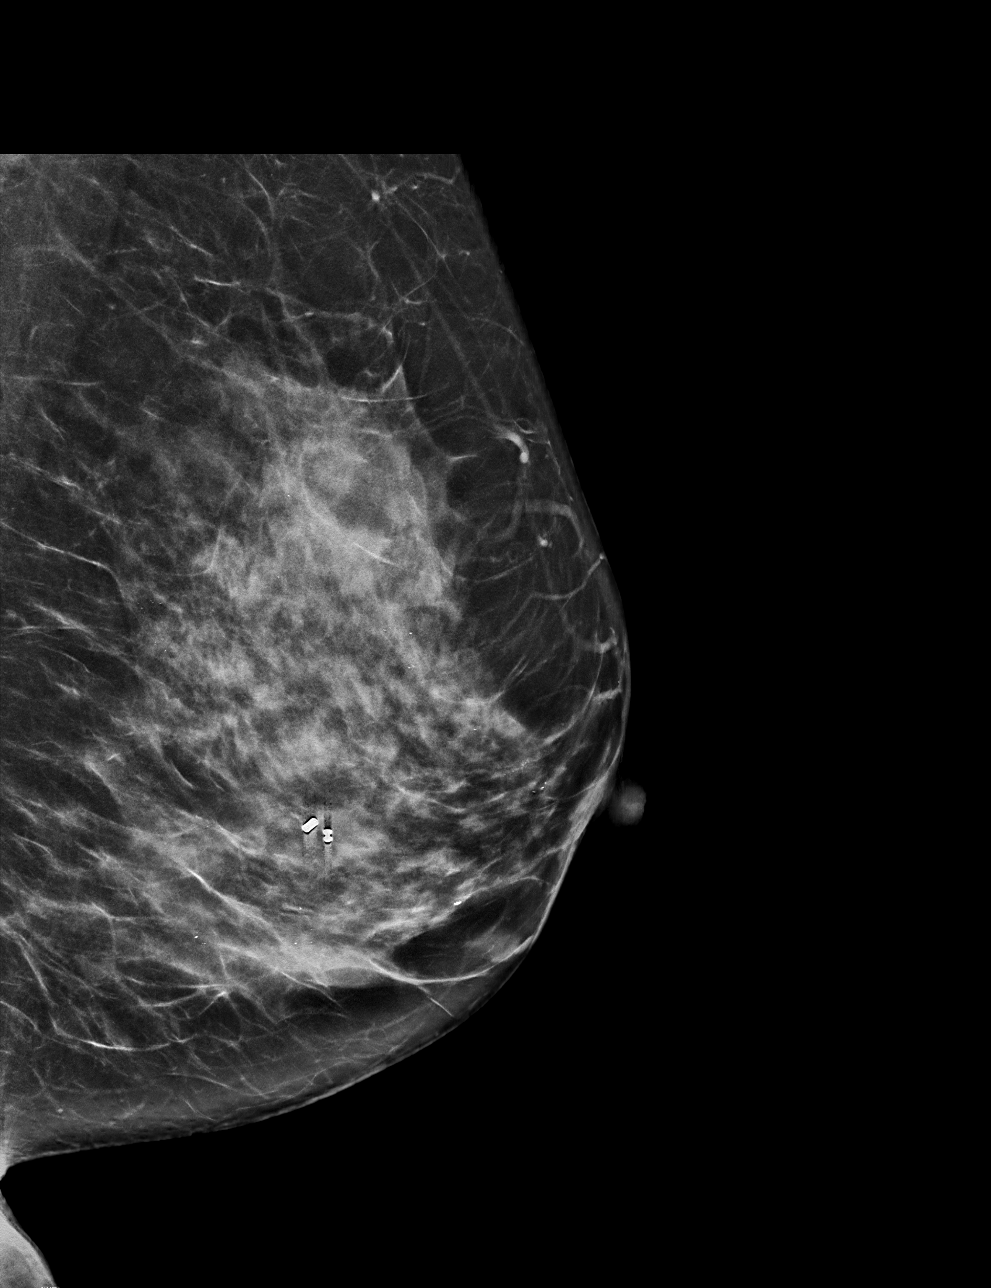

[L ML tomo · tomo slice 35/68.0]
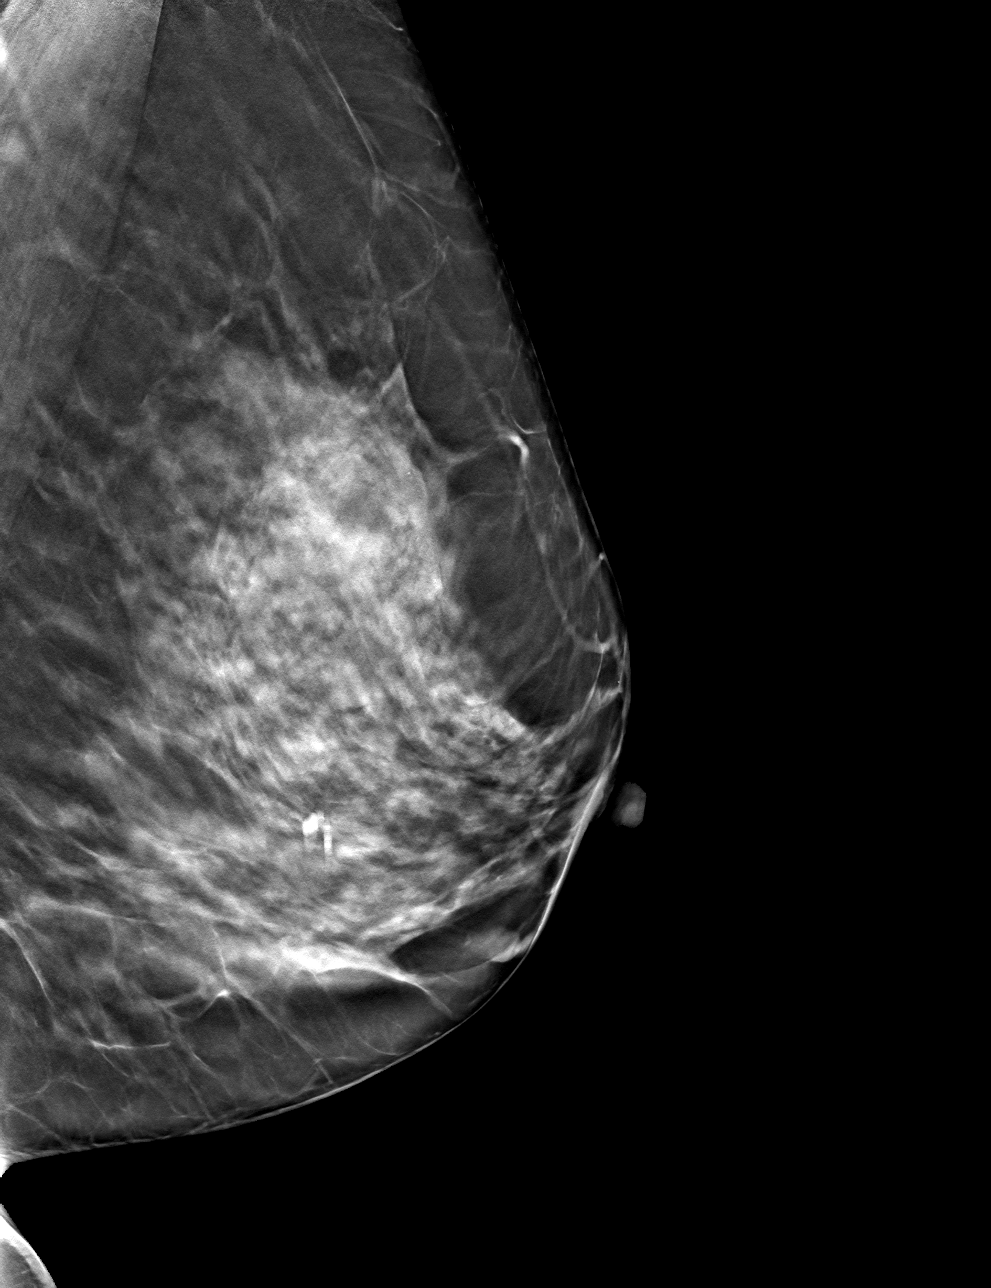

[L MLO tomo · tomo slice 35/69.0]
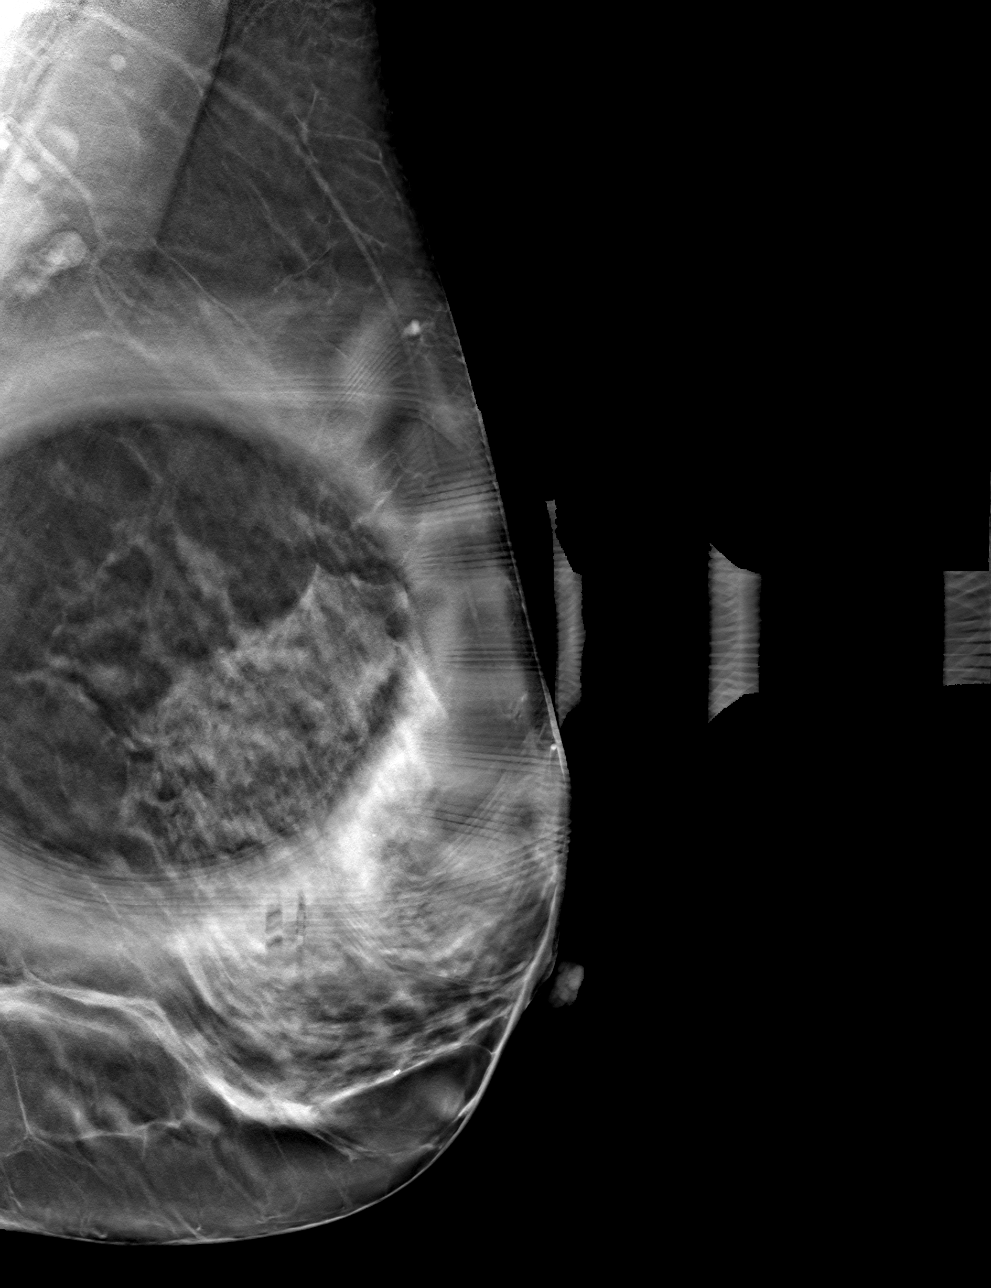

[4 of 12 positions shown; findings below may reference images not displayed]

ACR Breast Density Category c: The breast tissue is heterogeneously
dense, which may obscure small masses.
FINDINGS: Previously described, possible asymmetry in the deep central left
breast is seen on the MLO projection resolves into well dispersed
fibroglandular tissue on additional views. No suspicious findings
are identified.
IMPRESSION: No mammographic evidence of malignancy.

RECOMMENDATION:
Screening mammogram in one year.(Code:I6-H-J6U)

I have discussed the findings and recommendations with the patient.
If applicable, a reminder letter will be sent to the patient
regarding the next appointment.

BI-RADS CATEGORY  1: Negative.

## 2024-08-02 ENCOUNTER — Encounter (HOSPITAL_BASED_OUTPATIENT_CLINIC_OR_DEPARTMENT_OTHER): Payer: Self-pay | Admitting: Physician Assistant

## 2024-08-02 ENCOUNTER — Ambulatory Visit (INDEPENDENT_AMBULATORY_CARE_PROVIDER_SITE_OTHER): Admitting: Physician Assistant

## 2024-08-02 ENCOUNTER — Ambulatory Visit (HOSPITAL_BASED_OUTPATIENT_CLINIC_OR_DEPARTMENT_OTHER)

## 2024-08-02 DIAGNOSIS — M549 Dorsalgia, unspecified: Secondary | ICD-10-CM

## 2024-08-02 MED ORDER — METHOCARBAMOL 500 MG PO TABS: 500.0000 mg | ORAL_TABLET | Freq: Four times a day (QID) | ORAL | 0 refills | Status: DC | PRN

## 2024-08-02 MED ORDER — METHYLPREDNISOLONE 4 MG PO TBPK: ORAL_TABLET | ORAL | 0 refills | Status: AC

## 2024-08-02 NOTE — Progress Notes (Signed)
 "  Office Visit Note   Patient: [name]           Date of Birth: [birth year]           MRN: [id number] Visit Date: 08/02/2024              Requested by: Sophronia Ozell BROCKS, MD [address] [place],  [place] [id number] PCP: Sophronia Ozell BROCKS, MD   Assessment & Plan: Visit Diagnoses:  1. Mid back pain on right side     Plan: Patient is a pleasant active 54 year old woman comes in today with midthoracic medial border scapula pain.  She does not have any particular injury however she does some physical things with her job including lifting things bending over.  She does not have any neurologic deficits her strength is good she has a history of shoulder issues as had whole shoulder injections but I think this may be more related to muscle tightness and spasm.  She has slight swelling on the right medial scapular border compared to the left but no redness no erythema.  Would like to try treating her with a steroid Dosepak she was told how to take this and not to take other anti-inflammatories with it also we will have her give her a muscle relaxer.  If that does not help contact me could consider physical therapy we talked about modalities such as shots massage guns and TENS unit.  Follow-Up Instructions: No follow-ups on file.   Orders:  Orders Placed This Encounter  Procedures   DG Thoracic Spine 2 View   No orders of the defined types were placed in this encounter.     Procedures: No procedures performed   Clinical Data: No additional findings.   Subjective: No chief complaint on file.   HPI patient is a 54 year old woman comes in with right shoulder pain along the shoulder blade to her mid back.  Began 3 days ago feels like it is getting worse.  No no fall or injury.  She does get injections in her shoulder and last had them in early December by another provider.  She is currently taking ibuprofen  Review of Systems  All other systems reviewed and are  negative.    Objective: Vital Signs: There were no vitals taken for this visit.  Physical Exam Constitutional:      Appearance: Normal appearance.  Pulmonary:     Effort: Pulmonary effort is normal.  Skin:    General: Skin is warm and dry.  Neurological:     General: No focal deficit present.     Mental Status: She is alert and oriented to person, place, and time.  Psychiatric:        Mood and Affect: Mood normal.        Behavior: Behavior normal.     Ortho Exam Evaluation of her thoracic spine she has no step-offs she has slight asymmetry with just a little soft tissue swelling along the medial scapular border.  Tightness in the muscle as well.  She has full range of motion of her arms she has good strength 5 out of 5 with resisted abduction external/internal rotation Specialty Comments:  No specialty comments available.  Imaging: No results found.   PMFS History: Patient Active Problem List   Diagnosis Date Noted   Skin lesion 06/29/2019   Chronic constipation 02/04/2013   Mitral valve disease 05/17/2010   HYPOTENSION 05/17/2010   Vitamin D deficiency 05/16/2010   Anxiety state  05/16/2010   GERD 05/16/2010   VERTIGO 05/16/2010   SHORTNESS OF BREATH 05/16/2010   Past Medical History:  Diagnosis Date   Anxiety    Arthritis    mild knees   Constipation    on Linzess  and Miralax   Depression    GERD (gastroesophageal reflux disease)     Family History  Problem Relation Age of Onset   Arthritis Mother        avascular necrosis   Gallbladder disease Mother    Colon polyps Mother    Diabetes Father    Heart disease Father    Alcohol abuse Father    Pancreatitis Father    Cirrhosis Father        alcohol related   Depression Sister    Obesity Sister    Diverticulosis Maternal Grandmother    Lung cancer Maternal Grandfather    Lung cancer Paternal Grandfather    Colon cancer Neg Hx    Inflammatory bowel disease Neg Hx    Pancreatic cancer Neg Hx     Esophageal cancer Neg Hx    Stomach cancer Neg Hx    Rectal cancer Neg Hx     Past Surgical History:  Procedure Laterality Date   CESAREAN SECTION  2004/2007   IUD REMOVAL     TOOTH EXTRACTION     Social History   Occupational History   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

## 2024-08-04 ENCOUNTER — Encounter (HOSPITAL_BASED_OUTPATIENT_CLINIC_OR_DEPARTMENT_OTHER): Payer: Self-pay

## 2024-08-04 ENCOUNTER — Other Ambulatory Visit: Payer: Self-pay | Admitting: Physician Assistant

## 2024-08-04 MED ORDER — CYCLOBENZAPRINE HCL 10 MG PO TABS: 10.0000 mg | ORAL_TABLET | Freq: Three times a day (TID) | ORAL | 0 refills | Status: AC | PRN
